# Patient Record
Sex: Female | Born: 1970 | Race: Black or African American | Hispanic: No | Smoking: Never smoker
Health system: Southern US, Community
[De-identification: ages and names within clinical notes are randomized; demographics above are authoritative.]

## PROBLEM LIST (undated history)

## (undated) DIAGNOSIS — K219 Gastro-esophageal reflux disease without esophagitis: Secondary | ICD-10-CM

## (undated) DIAGNOSIS — F419 Anxiety disorder, unspecified: Secondary | ICD-10-CM

## (undated) DIAGNOSIS — I509 Heart failure, unspecified: Secondary | ICD-10-CM

## (undated) DIAGNOSIS — E119 Type 2 diabetes mellitus without complications: Secondary | ICD-10-CM

## (undated) DIAGNOSIS — J45909 Unspecified asthma, uncomplicated: Secondary | ICD-10-CM

## (undated) DIAGNOSIS — R51 Headache: Secondary | ICD-10-CM

## (undated) DIAGNOSIS — D860 Sarcoidosis of lung: Secondary | ICD-10-CM

## (undated) DIAGNOSIS — I10 Essential (primary) hypertension: Secondary | ICD-10-CM

## (undated) DIAGNOSIS — Z8489 Family history of other specified conditions: Secondary | ICD-10-CM

## (undated) DIAGNOSIS — R519 Headache, unspecified: Secondary | ICD-10-CM

## (undated) DIAGNOSIS — M25562 Pain in left knee: Secondary | ICD-10-CM

## (undated) DIAGNOSIS — M1990 Unspecified osteoarthritis, unspecified site: Secondary | ICD-10-CM

## (undated) DIAGNOSIS — G8929 Other chronic pain: Secondary | ICD-10-CM

## (undated) DIAGNOSIS — M25561 Pain in right knee: Secondary | ICD-10-CM

---

## 1993-01-28 HISTORY — PX: TUBAL LIGATION: SHX77

## 2005-01-28 HISTORY — PX: ACHILLES TENDON REPAIR: SUR1153

## 2006-04-29 HISTORY — PX: KNEE ARTHROSCOPY: SHX127

## 2014-09-10 ENCOUNTER — Inpatient Hospital Stay (HOSPITAL_BASED_OUTPATIENT_CLINIC_OR_DEPARTMENT_OTHER)
Admission: EM | Admit: 2014-09-10 | Discharge: 2014-09-13 | DRG: 292 | Disposition: A | Discharge status: Home / Self Care | Payer: Medicare Other | Attending: Internal Medicine | Admitting: Internal Medicine

## 2014-09-10 ENCOUNTER — Encounter (HOSPITAL_BASED_OUTPATIENT_CLINIC_OR_DEPARTMENT_OTHER): Payer: Self-pay | Admitting: *Deleted

## 2014-09-10 ENCOUNTER — Emergency Department (HOSPITAL_BASED_OUTPATIENT_CLINIC_OR_DEPARTMENT_OTHER): Payer: Medicare Other

## 2014-09-10 ENCOUNTER — Other Ambulatory Visit: Payer: Self-pay

## 2014-09-10 DIAGNOSIS — I10 Essential (primary) hypertension: Secondary | ICD-10-CM | POA: Diagnosis present

## 2014-09-10 DIAGNOSIS — I5031 Acute diastolic (congestive) heart failure: Principal | ICD-10-CM | POA: Diagnosis present

## 2014-09-10 DIAGNOSIS — J45909 Unspecified asthma, uncomplicated: Secondary | ICD-10-CM | POA: Diagnosis present

## 2014-09-10 DIAGNOSIS — D860 Sarcoidosis of lung: Secondary | ICD-10-CM | POA: Diagnosis present

## 2014-09-10 DIAGNOSIS — Z885 Allergy status to narcotic agent status: Secondary | ICD-10-CM | POA: Diagnosis not present

## 2014-09-10 DIAGNOSIS — E119 Type 2 diabetes mellitus without complications: Secondary | ICD-10-CM | POA: Diagnosis present

## 2014-09-10 DIAGNOSIS — Z79899 Other long term (current) drug therapy: Secondary | ICD-10-CM

## 2014-09-10 DIAGNOSIS — Z888 Allergy status to other drugs, medicaments and biological substances status: Secondary | ICD-10-CM | POA: Diagnosis not present

## 2014-09-10 DIAGNOSIS — I5032 Chronic diastolic (congestive) heart failure: Secondary | ICD-10-CM

## 2014-09-10 DIAGNOSIS — M1990 Unspecified osteoarthritis, unspecified site: Secondary | ICD-10-CM | POA: Diagnosis present

## 2014-09-10 DIAGNOSIS — Z6841 Body Mass Index (BMI) 40.0 and over, adult: Secondary | ICD-10-CM

## 2014-09-10 DIAGNOSIS — D869 Sarcoidosis, unspecified: Secondary | ICD-10-CM | POA: Diagnosis present

## 2014-09-10 DIAGNOSIS — I509 Heart failure, unspecified: Secondary | ICD-10-CM

## 2014-09-10 DIAGNOSIS — Z91010 Allergy to peanuts: Secondary | ICD-10-CM | POA: Diagnosis not present

## 2014-09-10 HISTORY — DX: Essential (primary) hypertension: I10

## 2014-09-10 HISTORY — DX: Unspecified osteoarthritis, unspecified site: M19.90

## 2014-09-10 HISTORY — DX: Unspecified asthma, uncomplicated: J45.909

## 2014-09-10 LAB — CBC WITH DIFFERENTIAL/PLATELET
BASOS ABS: 0 10*3/uL (ref 0.0–0.1)
Basophils Relative: 0 % (ref 0–1)
EOS PCT: 3 % (ref 0–5)
Eosinophils Absolute: 0.2 10*3/uL (ref 0.0–0.7)
HEMATOCRIT: 36.1 % (ref 36.0–46.0)
HEMOGLOBIN: 11 g/dL — AB (ref 12.0–15.0)
LYMPHS ABS: 1.1 10*3/uL (ref 0.7–4.0)
Lymphocytes Relative: 16 % (ref 12–46)
MCH: 23.3 pg — AB (ref 26.0–34.0)
MCHC: 30.5 g/dL (ref 30.0–36.0)
MCV: 76.5 fL — AB (ref 78.0–100.0)
MONOS PCT: 14 % — AB (ref 3–12)
Monocytes Absolute: 1 10*3/uL (ref 0.1–1.0)
NEUTROS ABS: 4.8 10*3/uL (ref 1.7–7.7)
NEUTROS PCT: 67 % (ref 43–77)
PLATELETS: 395 10*3/uL (ref 150–400)
RBC: 4.72 MIL/uL (ref 3.87–5.11)
RDW: 17.1 % — ABNORMAL HIGH (ref 11.5–15.5)
WBC: 7.2 10*3/uL (ref 4.0–10.5)

## 2014-09-10 LAB — BASIC METABOLIC PANEL
ANION GAP: 7 (ref 5–15)
BUN: 9 mg/dL (ref 6–20)
CO2: 28 mmol/L (ref 22–32)
CREATININE: 0.75 mg/dL (ref 0.44–1.00)
Calcium: 9 mg/dL (ref 8.9–10.3)
Chloride: 103 mmol/L (ref 101–111)
Glucose, Bld: 102 mg/dL — ABNORMAL HIGH (ref 65–99)
POTASSIUM: 3.6 mmol/L (ref 3.5–5.1)
SODIUM: 138 mmol/L (ref 135–145)

## 2014-09-10 LAB — URINALYSIS, ROUTINE W REFLEX MICROSCOPIC
BILIRUBIN URINE: NEGATIVE
GLUCOSE, UA: NEGATIVE mg/dL
Hgb urine dipstick: NEGATIVE
KETONES UR: NEGATIVE mg/dL
LEUKOCYTES UA: NEGATIVE
Nitrite: NEGATIVE
PROTEIN: NEGATIVE mg/dL
SPECIFIC GRAVITY, URINE: 1.021 (ref 1.005–1.030)
UROBILINOGEN UA: 2 mg/dL — AB (ref 0.0–1.0)
pH: 6.5 (ref 5.0–8.0)

## 2014-09-10 LAB — BRAIN NATRIURETIC PEPTIDE: B NATRIURETIC PEPTIDE 5: 225 pg/mL — AB (ref 0.0–100.0)

## 2014-09-10 LAB — TROPONIN I

## 2014-09-10 MED ORDER — FUROSEMIDE 10 MG/ML IJ SOLN
40.0000 mg | Freq: Once | INTRAMUSCULAR | Status: AC
  Administered 2014-09-10: 40 mg via INTRAVENOUS
  Filled 2014-09-10: qty 4

## 2014-09-10 NOTE — ED Notes (Signed)
Pt advised the blood pressure of 161/135 is good for her.

## 2014-09-10 NOTE — ED Notes (Signed)
Pt reports ongoing lower extremity swelling for "a while"- worse x 1 month- pt takes lasix PRN

## 2014-09-10 NOTE — ED Notes (Signed)
Pulse OX at rest before walking was only 91%. We walked 1 lap around department and got very SOB. SAT dropped to 86%. Placed patient on monitor when we arrived back to room, RN and MD aware

## 2014-09-10 NOTE — ED Provider Notes (Signed)
CSN: [id number]     Arrival date & time 09/10/14  1545 History  This chart was scribed for Blake Divine, MD by Placido Sou, ED scribe. This patient was seen in room MH04/MH04 and the patient's care was started at 4:18 PM.   Chief Complaint  Patient presents with  . Leg Swelling   Patient is a 44 y.o. female presenting with shortness of breath. The history is provided by the patient. No language interpreter was used.  Shortness of Breath Severity:  Mild Onset quality:  Unable to specify Duration:  1 month Timing:  Constant Progression:  Waxing and waning Chronicity:  Recurrent Context: activity   Relieved by:  Rest and sitting up Worsened by:  Movement, exertion and activity Associated symptoms: cough   Associated symptoms: no chest pain and no fever   Risk factors: obesity     HPI Comments: Shelby Figueroa is a 44 y.o. female, with a hx of HTN and DM, who presents to the Emergency Department complaining of chronic, moderate, bilateral leg swelling with worsening symptoms beginning 1 month ago. She notes associated pain which she describes as burning and SOB. Pt notes a worsening of her SOB when ambulating or lying in a supine position. She notes being dx with sarcoidosis in 07/2012 and further notes a chronic productive cough with onset s/p this dx. Pt notes a hx of cellulitis to her bilateral legs 2 months ago and notes these issues feel similar. Pt notes taking lasix previously and was taken off of the medication 1 month ago based on her PCP's recommendation. She denies ever receiving any cardiac tests or being dx with CHF. Pt notes having not taken her metformin rx for 2 days due to being out and denies any recent abnormalities with her blood sugar levels. She notes currently taking gabapentin. Pt denies any new fever or chills, worsening symptoms of her chronic productive cough and CP.   Past Medical History  Diagnosis Date  . Arthritis   . Asthma   . Diabetes mellitus without  complication   . Hypertension   . Sarcoidosis    Past Surgical History  Procedure Laterality Date  . Achilles tendon repair Bilateral   . Knee surgery Left    No family history on file. Social History  Substance Use Topics  . Smoking status: Never Smoker   . Smokeless tobacco: Never Used  . Alcohol Use: Yes     Comment: occasional   OB History    No data available     Review of Systems  Constitutional: Negative for fever and chills.  Respiratory: Positive for cough and shortness of breath.   Cardiovascular: Positive for leg swelling. Negative for chest pain.  Musculoskeletal: Positive for myalgias, joint swelling and arthralgias.  All other systems reviewed and are negative.   Allergies  Codeine  Home Medications   Prior to Admission medications   Medication Sig Start Date End Date Taking? Authorizing Provider  amLODipine (NORVASC) 5 MG tablet Take 5 mg by mouth daily.   Yes Historical Provider, MD  citalopram (CELEXA) 40 MG tablet Take 40 mg by mouth daily.   Yes Historical Provider, MD  furosemide (LASIX) 20 MG tablet Take 10 mg by mouth.   Yes Historical Provider, MD  gabapentin (NEURONTIN) 300 MG capsule Take 300 mg by mouth 3 (three) times daily.   Yes Historical Provider, MD  metFORMIN (GLUCOPHAGE) 500 MG tablet Take by mouth 2 (two) times daily with a meal.   Yes Historical Provider,  MD  metoprolol succinate (TOPROL-XL) 50 MG 24 hr tablet Take 50 mg by mouth daily. Take with or immediately following a meal.   Yes Historical Provider, MD   BP 161/135 mmHg  Pulse 80  Temp(Src) 98.9 F (37.2 C) (Oral)  Resp 18  Ht 5\' 5"  (1.651 m)  Wt 347 lb (157.398 kg)  BMI 57.74 kg/m2  SpO2 94%  LMP 08/18/2014 Physical Exam  Constitutional: She is oriented to person, place, and time. She appears well-developed and well-nourished. No distress.  Obese  HENT:  Head: Normocephalic and atraumatic.  Mouth/Throat: Oropharynx is clear and moist.  Eyes: Conjunctivae are normal.  Pupils are equal, round, and reactive to light. No scleral icterus.  Neck: Neck supple.  Cardiovascular: Normal rate, regular rhythm, normal heart sounds and intact distal pulses.   No murmur heard. Pulmonary/Chest: Effort normal and breath sounds normal. No stridor. No respiratory distress. She has no wheezes. She has no rales.  Abdominal: Soft. Bowel sounds are normal. She exhibits no distension. There is no tenderness.  Musculoskeletal: Normal range of motion. She exhibits edema (trace edema bilateral lower extremity. exam limited by obesity.).  Neurological: She is alert and oriented to person, place, and time.  Skin: Skin is warm and dry. No rash noted.  Psychiatric: She has a normal mood and affect. Her behavior is normal.  Nursing note and vitals reviewed.   ED Course  Procedures  DIAGNOSTIC STUDIES: Oxygen Saturation is 94% on RA, adequate by my interpretation.    COORDINATION OF CARE: 4:26 PM Discussed treatment plan with pt at bedside and pt agreed to plan.  Labs Review Labs Reviewed  CBC WITH DIFFERENTIAL/PLATELET - Abnormal; Notable for the following:    Hemoglobin 11.0 (*)    MCV 76.5 (*)    MCH 23.3 (*)    RDW 17.1 (*)    Monocytes Relative 14 (*)    All other components within normal limits  BASIC METABOLIC PANEL - Abnormal; Notable for the following:    Glucose, Bld 102 (*)    All other components within normal limits  URINALYSIS, ROUTINE W REFLEX MICROSCOPIC (NOT AT [facility]) - Abnormal; Notable for the following:    APPearance CLOUDY (*)    Urobilinogen, UA 2.0 (*)    All other components within normal limits  BRAIN NATRIURETIC PEPTIDE - Abnormal; Notable for the following:    B Natriuretic Peptide 225.0 (*)    All other components within normal limits  TROPONIN I    Imaging Review Dg Chest 2 View  09/10/2014   CLINICAL DATA:  44 year old female with bilateral lower extremity swelling and shortness breath for the past 6 weeks.  EXAM: CHEST  2 VIEW   COMPARISON:  No priors.  FINDINGS: There is cephalization of the pulmonary vasculature, indistinctness of the interstitial markings, and patchy airspace disease throughout the lungs bilaterally suggestive of moderate pulmonary edema. No pleural effusions. Mild cardiomegaly. Upper mediastinal contours are within normal limits.  IMPRESSION: 1. The appearance the chest suggests congestive heart failure, although some of the interstitial prominence noted may be chronic based on comparison to prior CT of the abdomen and pelvis 07/03/2014.   Electronically Signed   By: Trudie Reed M.D.   On: 09/10/2014 17:32   I, Blake Divine, MD, personally reviewed and evaluated these images and lab results as part of my medical decision-making.   EKG Interpretation None     EKG - NSR, rate 83, normal axis, QTc 493, no ST/T abnormalities. MDM  Final diagnoses:  Acute congestive heart failure, unspecified congestive heart failure type    Patient became extremely dyspneic and and also hypoxic upon ambulating. Her workups consistent with CHF. Given 40 mg grams of IV Lasix. Plan admit.  I personally performed the services described in this documentation, which was scribed in my presence. The recorded information has been reviewed and is accurate.     Blake Divine, MD 09/10/14 [phone]

## 2014-09-10 NOTE — ED Notes (Signed)
ECG was completed and copy given to Dr. At 1913 pm but appears did not cross over.

## 2014-09-11 ENCOUNTER — Encounter (HOSPITAL_COMMUNITY): Payer: Self-pay | Admitting: Surgery

## 2014-09-11 DIAGNOSIS — J45909 Unspecified asthma, uncomplicated: Secondary | ICD-10-CM

## 2014-09-11 DIAGNOSIS — D869 Sarcoidosis, unspecified: Secondary | ICD-10-CM | POA: Diagnosis present

## 2014-09-11 DIAGNOSIS — I5031 Acute diastolic (congestive) heart failure: Principal | ICD-10-CM

## 2014-09-11 DIAGNOSIS — I10 Essential (primary) hypertension: Secondary | ICD-10-CM | POA: Diagnosis present

## 2014-09-11 DIAGNOSIS — E119 Type 2 diabetes mellitus without complications: Secondary | ICD-10-CM

## 2014-09-11 LAB — GLUCOSE, CAPILLARY
GLUCOSE-CAPILLARY: 117 mg/dL — AB (ref 65–99)
GLUCOSE-CAPILLARY: 152 mg/dL — AB (ref 65–99)
GLUCOSE-CAPILLARY: 97 mg/dL (ref 65–99)
GLUCOSE-CAPILLARY: 108 mg/dL — AB (ref 65–99)
Glucose-Capillary: 112 mg/dL — ABNORMAL HIGH (ref 65–99)
Glucose-Capillary: 167 mg/dL — ABNORMAL HIGH (ref 65–99)

## 2014-09-11 MED ORDER — SODIUM CHLORIDE 0.9 % IJ SOLN: 3.0000 mL | INTRAMUSCULAR | Status: DC | PRN

## 2014-09-11 MED ORDER — IPRATROPIUM-ALBUTEROL 0.5-2.5 (3) MG/3ML IN SOLN
3.0000 mL | Freq: Three times a day (TID) | RESPIRATORY_TRACT | Status: DC
  Administered 2014-09-11 – 2014-09-13 (×7): 3 mL via RESPIRATORY_TRACT
  Filled 2014-09-11 (×7): qty 3

## 2014-09-11 MED ORDER — SODIUM CHLORIDE 0.9 % IV SOLN: 250.0000 mL | INTRAVENOUS | Status: DC | PRN

## 2014-09-11 MED ORDER — INSULIN ASPART 100 UNIT/ML ~~LOC~~ SOLN: 0.0000 [IU] | Freq: Every day | SUBCUTANEOUS | Status: DC

## 2014-09-11 MED ORDER — METOPROLOL SUCCINATE ER 50 MG PO TB24
50.0000 mg | ORAL_TABLET | Freq: Every day | ORAL | Status: DC
  Administered 2014-09-11 – 2014-09-13 (×3): 50 mg via ORAL
  Filled 2014-09-11 (×3): qty 1

## 2014-09-11 MED ORDER — LISINOPRIL 2.5 MG PO TABS
2.5000 mg | ORAL_TABLET | Freq: Every day | ORAL | Status: DC
  Administered 2014-09-11: 2.5 mg via ORAL
  Filled 2014-09-11: qty 1

## 2014-09-11 MED ORDER — SODIUM CHLORIDE 0.9 % IJ SOLN
3.0000 mL | Freq: Two times a day (BID) | INTRAMUSCULAR | Status: DC
  Administered 2014-09-11 – 2014-09-12 (×5): 3 mL via INTRAVENOUS

## 2014-09-11 MED ORDER — INSULIN ASPART 100 UNIT/ML ~~LOC~~ SOLN
0.0000 [IU] | Freq: Three times a day (TID) | SUBCUTANEOUS | Status: DC
  Administered 2014-09-11: 2 [IU] via SUBCUTANEOUS
  Administered 2014-09-12: 1 [IU] via SUBCUTANEOUS

## 2014-09-11 MED ORDER — AMLODIPINE BESYLATE 5 MG PO TABS
5.0000 mg | ORAL_TABLET | Freq: Every day | ORAL | Status: DC
  Administered 2014-09-11 – 2014-09-13 (×3): 5 mg via ORAL
  Filled 2014-09-11 (×3): qty 1

## 2014-09-11 MED ORDER — ONDANSETRON HCL 4 MG/2ML IJ SOLN: 4.0000 mg | Freq: Four times a day (QID) | INTRAMUSCULAR | Status: DC | PRN

## 2014-09-11 MED ORDER — ACETAMINOPHEN 325 MG PO TABS
650.0000 mg | ORAL_TABLET | ORAL | Status: DC | PRN
  Administered 2014-09-11 – 2014-09-12 (×3): 650 mg via ORAL
  Filled 2014-09-11 (×3): qty 2

## 2014-09-11 MED ORDER — POTASSIUM CHLORIDE CRYS ER 20 MEQ PO TBCR
20.0000 meq | EXTENDED_RELEASE_TABLET | Freq: Two times a day (BID) | ORAL | Status: AC
  Administered 2014-09-11 – 2014-09-12 (×4): 20 meq via ORAL
  Filled 2014-09-11 (×5): qty 1

## 2014-09-11 MED ORDER — FUROSEMIDE 10 MG/ML IJ SOLN
40.0000 mg | Freq: Two times a day (BID) | INTRAMUSCULAR | Status: AC
  Administered 2014-09-11 – 2014-09-12 (×4): 40 mg via INTRAVENOUS
  Filled 2014-09-11 (×5): qty 4

## 2014-09-11 MED ORDER — ENOXAPARIN SODIUM 40 MG/0.4ML ~~LOC~~ SOLN
40.0000 mg | SUBCUTANEOUS | Status: DC
  Administered 2014-09-11 – 2014-09-13 (×3): 40 mg via SUBCUTANEOUS
  Filled 2014-09-11 (×5): qty 0.4

## 2014-09-11 MED ORDER — GABAPENTIN 300 MG PO CAPS
300.0000 mg | ORAL_CAPSULE | Freq: Three times a day (TID) | ORAL | Status: DC
  Administered 2014-09-11 – 2014-09-13 (×8): 300 mg via ORAL
  Filled 2014-09-11 (×9): qty 1

## 2014-09-11 MED ORDER — ALBUTEROL SULFATE (2.5 MG/3ML) 0.083% IN NEBU: 2.5000 mg | INHALATION_SOLUTION | RESPIRATORY_TRACT | Status: DC | PRN

## 2014-09-11 MED ORDER — CITALOPRAM HYDROBROMIDE 40 MG PO TABS
40.0000 mg | ORAL_TABLET | Freq: Every day | ORAL | Status: DC
  Administered 2014-09-11 – 2014-09-13 (×3): 40 mg via ORAL
  Filled 2014-09-11 (×3): qty 1

## 2014-09-11 MED ORDER — OXYCODONE HCL 5 MG PO TABS
5.0000 mg | ORAL_TABLET | Freq: Four times a day (QID) | ORAL | Status: DC | PRN
  Administered 2014-09-12: 5 mg via ORAL
  Filled 2014-09-11: qty 1

## 2014-09-11 NOTE — H&P (Signed)
[facility] Admission History and Physical       Shelby Figueroa ZOX:[id number] DOB: 09/18/1970 DOA: 09/10/2014  Referring physician: EDP PCP: COPPER, Landis Gandy, PA-C  Specialists:   Chief Complaint: SOB and Swelling in Both Legs  HPI: Shelby Figueroa is a 44 y.o. female with a history of HTN, DM2, Sarcoid and Asthma who presented to the [facility] ED with complaints of worsening SOB, and Swelling of her lower legs over the past 6 weeks. She denies having any chest pain, or fevers or chills.   She was found to have hypoxia into the mid-80's in the ED and has to be placed on 2 liters NCO2.   She was found on evaluation to have CHF findings on Chest X-ray and an elevated BNP of 225.0 and was administered 40 mg of IV Lasix x 1.  She was transferred to [facility] for further evaluation and treatment.       Review of Systems:  Constitutional: No Weight Loss, No Weight Gain, Night Sweats, Fevers, Chills, Dizziness, Light Headedness, Fatigue, or Generalized Weakness HEENT: No Headaches, Difficulty Swallowing,Tooth/Dental Problems,Sore Throat,  No Sneezing, Rhinitis, Ear Ache, Nasal Congestion, or Post Nasal Drip,  Cardio-vascular:  No Chest pain, +Orthopnea, PND, +Edema in Lower Extremities, Anasarca, Dizziness, Palpitations  Resp:  +Dyspnea, +DOE, No Productive Cough, No Non-Productive Cough, No Hemoptysis, No Wheezing.    GI: No Heartburn, Indigestion, Abdominal Pain, Nausea, Vomiting, Diarrhea, Constipation, Hematemesis, Hematochezia, Melena, Change in Bowel Habits,  Loss of Appetite  GU: No Dysuria, No Change in Color of Urine, No Urgency or Urinary Frequency, No Flank pain.  Musculoskeletal: No Joint Pain or Swelling, No Decreased Range of Motion, No Back Pain.  Neurologic: No Syncope, No Seizures, Muscle Weakness, Paresthesia, Vision Disturbance or Loss, No Diplopia, No Vertigo, No Difficulty Walking,  Skin: No Rash or Lesions. Psych: No Change in Mood or Affect, No Depression or Anxiety, No  Memory loss, No Confusion, or Hallucinations   Past Medical History  Diagnosis Date  . Arthritis   . Asthma   . Diabetes mellitus without complication   . Hypertension   . Sarcoidosis      Past Surgical History  Procedure Laterality Date  . Achilles tendon repair Bilateral   . Knee surgery Left   . Tubal ligation      20 years ago      Prior to Admission medications   Medication Sig Start Date End Date Taking? Authorizing Provider  amLODipine (NORVASC) 5 MG tablet Take 5 mg by mouth daily.   Yes Historical Provider, MD  citalopram (CELEXA) 40 MG tablet Take 40 mg by mouth daily.   Yes Historical Provider, MD  furosemide (LASIX) 20 MG tablet Take 10 mg by mouth.   Yes Historical Provider, MD  gabapentin (NEURONTIN) 300 MG capsule Take 300 mg by mouth 3 (three) times daily.   Yes Historical Provider, MD  metFORMIN (GLUCOPHAGE) 500 MG tablet Take by mouth 2 (two) times daily with a meal.   Yes Historical Provider, MD  metoprolol succinate (TOPROL-XL) 50 MG 24 hr tablet Take 50 mg by mouth daily. Take with or immediately following a meal.   Yes Historical Provider, MD     Allergies  Allergen Reactions  . Codeine Itching  . Peanuts [Peanut Oil]     Itching    Social History:  reports that she has never smoked. She has never used smokeless tobacco. She reports that she drinks alcohol. She reports that she does not use illicit drugs.  History reviewed. No pertinent family history.     Physical Exam:  GEN:  Pleasant Obese  44 y.o. African American female examined and in no acute distress; cooperative with exam Filed Vitals:   09/10/14 2115 09/10/14 2133 09/10/14 2133 09/10/14 2209  BP:  158/94 159/82 146/83  Pulse: 83 81 82 78  Temp:      TempSrc:   Oral   Resp: 21 24 20 23   Height:      Weight:      SpO2: 100% 98% 98% 98%   Blood pressure 146/83, pulse 78, temperature 98.9 F (37.2 C), temperature source Oral, resp. rate 23, height 5\' 5"  (1.651 m), weight  157.398 kg (347 lb), last menstrual period 08/18/2014, SpO2 98 %. PSYCH: She is alert and oriented x4; does not appear anxious does not appear depressed; affect is normal HEENT: Normocephalic and Atraumatic, Mucous membranes pink; PERRLA; EOM intact; Fundi:  Benign;  No scleral icterus, Nares: Patent, Oropharynx: Clear, Fair Dentition,    Neck:  FROM, No Cervical Lymphadenopathy nor Thyromegaly or Carotid Bruit; No JVD; Breasts:: Not examined CHEST WALL: No tenderness CHEST: Normal respiration, clear to auscultation bilaterally HEART: Regular rate and rhythm; no murmurs rubs or gallops BACK: No kyphosis or scoliosis; No CVA tenderness ABDOMEN: Positive Bowel Sounds, Obese, Soft Non-Tender, No Rebound or Guarding; No Masses, No Organomegaly. Rectal Exam: Not done EXTREMITIES: No Cyanosis Clubbing, 2+ EDEMA of BLEs    Genitalia: not examined PULSES: 2+ and symmetric SKIN: Normal hydration no rash or ulceration CNS:  Alert and Oriented x 4, No Focal Deficits Vascular: pulses palpable throughout    Labs on Admission:  Basic Metabolic Panel:  Recent Labs Lab 09/10/14 1705  NA 138  K 3.6  CL 103  CO2 28  GLUCOSE 102*  BUN 9  CREATININE 0.75  CALCIUM 9.0   Liver Function Tests: No results for input(s): AST, ALT, ALKPHOS, BILITOT, PROT, ALBUMIN in the last 168 hours. No results for input(s): LIPASE, AMYLASE in the last 168 hours. No results for input(s): AMMONIA in the last 168 hours. CBC:  Recent Labs Lab 09/10/14 1705  WBC 7.2  NEUTROABS 4.8  HGB 11.0*  HCT 36.1  MCV 76.5*  PLT 395   Cardiac Enzymes:  Recent Labs Lab 09/10/14 1705  TROPONINI <0.03    BNP (last 3 results)  Recent Labs  09/10/14 1705  BNP 225.0*    ProBNP (last 3 results) No results for input(s): PROBNP in the last 8760 hours.  CBG: No results for input(s): GLUCAP in the last 168 hours.  Radiological Exams on Admission: Dg Chest 2 View  09/10/2014   CLINICAL DATA:  44 year old female  with bilateral lower extremity swelling and shortness breath for the past 6 weeks.  EXAM: CHEST  2 VIEW  COMPARISON:  No priors.  FINDINGS: There is cephalization of the pulmonary vasculature, indistinctness of the interstitial markings, and patchy airspace disease throughout the lungs bilaterally suggestive of moderate pulmonary edema. No pleural effusions. Mild cardiomegaly. Upper mediastinal contours are within normal limits.  IMPRESSION: 1. The appearance the chest suggests congestive heart failure, although some of the interstitial prominence noted may be chronic based on comparison to prior CT of the abdomen and pelvis 07/03/2014.   Electronically Signed   By: Trudie Reed M.D.   On: 09/10/2014 17:32     EKG: Independently reviewed. Normal Sinus Rhythm   Rate = 83, No Acute S-T changes        Assessment/Plan:  44 y.o. female with  Principal Problem:   1.     Acute CHF- Probably Diastolic   Acute CHF Protocol    Diurese with IV lasix 40 mg q 12 hrs x 4 doses    Monitor I/Os and Daily Weights   Continue Toprol Xl   Add Ace Inhibitor - Lisinopril 2.5 mg PO q day   2D ECHO in AM   O2 PRN   Monitor O2 sats  Active Problems:   2.    Diabetes mellitus without complication   Hold Metformin Rx   SSi coverage PRN   Check HbA1C in AM     3.    Hypertension   Continue Amlodipine, and Toprol Rx   Adjust lasix dosage   And Lisinopril added     4.    Sarcoidosis   stable     5.    Asthma   PRN Albuterol Nebs    6.     DVT Prophylaxis   Lovenox    Code Status:     FULL CODE        Family Communication:   No Family Present    Disposition Plan:    Inpatient Status        Time spent:  75 Minutes      Ron Parker [facility] Pager [phone]   If 7AM -7PM Please Contact the Day Rounding Team MD for [facility]  If 7PM-7AM, Please Contact Night-Floor Coverage  [email or web address] Password [email or web address] 09/11/2014, 12:30 AM     ADDENDUM:   Patient was seen  and examined on 09/11/2014

## 2014-09-11 NOTE — Progress Notes (Signed)
PATIENT DETAILS Name: Shelby Figueroa Age: 44 y.o. Sex: female Date of Birth: [birth year] Admit Date: 09/10/2014 Admitting Physician Ron Parker, MD ZOX:[id number], JOSEPH B, PA-C  Subjective: Decreased  lower extremity edema, less short of breath today.  Assessment/Plan: Principal Problem: Presumed acute diastolic heart failure: Continue IV Lasix, given history of sarcoidosis-could have a pulmonary hypertension/RHF-await echocardiogram. Discontinue ACE inhibitor-has history of angioedema  Active Problems: Pulmonary sarcoidosis: Not on steroids, check Ace level. Follow  Essential hypertension: Controlled-continue amlodipine and metoprolol  Type 2 diabetes: Continue to hold metformin-CBGs controlled with SSI  Morbid obesity: Tonsil regarding importance of weight loss. Likely needs outpatient pulmonary follow-up for presumed OSA and sleep study  Disposition: Remain inpatient  Antimicrobial agents  See below  Anti-infectives    None      DVT Prophylaxis: Prophylactic Lovenox   Code Status: Full code   Family Communication None  Procedures: None  CONSULTS:  None  Time spent 30 minutes-Greater than 50% of this time was spent in counseling, explanation of diagnosis, planning of further management, and coordination of care.  MEDICATIONS: Scheduled Meds: . amLODipine  5 mg Oral Daily  . citalopram  40 mg Oral Daily  . enoxaparin (LOVENOX) injection  40 mg Subcutaneous Q24H  . furosemide  40 mg Intravenous Q12H  . gabapentin  300 mg Oral TID  . insulin aspart  0-5 Units Subcutaneous QHS  . insulin aspart  0-9 Units Subcutaneous TID WC  . metoprolol succinate  50 mg Oral Daily  . potassium chloride  20 mEq Oral BID  . sodium chloride  3 mL Intravenous Q12H   Continuous Infusions:  PRN Meds:.sodium chloride, acetaminophen, ondansetron (ZOFRAN) IV, sodium chloride    PHYSICAL EXAM: Vital signs in last 24 hours: Filed Vitals:   09/10/14  2209 09/11/14 0125 09/11/14 0314 09/11/14 0635  BP: 146/83 145/85 142/88 122/76  Pulse: 78 82 76 81  Temp:  98.3 F (36.8 C) 98.3 F (36.8 C) 98.4 F (36.9 C)  TempSrc:  Oral Oral Oral  Resp: 23 20 24 22   Height:      Weight:    161.118 kg (355 lb 3.2 oz)  SpO2: 98% 99% 99% 99%    Weight change:  Filed Weights   09/10/14 1600 09/11/14 0635  Weight: 157.398 kg (347 lb) 161.118 kg (355 lb 3.2 oz)   Body mass index is 59.11 kg/(m^2).   Gen Exam: Awake and alert with clear speech.   Neck: Supple, No JVD.   Chest: +rales bilaterally CVS: S1 S2 Regular, no murmurs.  Abdomen: soft, BS +, non tender, non distended.  Extremities: ++ edema, lower extremities warm to touch. Neurologic: Non Focal.   Skin: No Rash.   Wounds: N/A.   Intake/Output from previous day: No intake or output data in the 24 hours ending 09/11/14 1342   LAB RESULTS: CBC  Recent Labs Lab 09/10/14 1705  WBC 7.2  HGB 11.0*  HCT 36.1  PLT 395  MCV 76.5*  MCH 23.3*  MCHC 30.5  RDW 17.1*  LYMPHSABS 1.1  MONOABS 1.0  EOSABS 0.2  BASOSABS 0.0    Chemistries   Recent Labs Lab 09/10/14 1705  NA 138  K 3.6  CL 103  CO2 28  GLUCOSE 102*  BUN 9  CREATININE 0.75  CALCIUM 9.0    CBG:  Recent Labs Lab 09/11/14 0021 09/11/14 0658 09/11/14 1138  GLUCAP 112* 117* 152*  GFR Estimated Creatinine Clearance: 136.8 mL/min (by C-G formula based on Cr of 0.75).  Coagulation profile No results for input(s): INR, PROTIME in the last 168 hours.  Cardiac Enzymes  Recent Labs Lab 09/10/14 1705  TROPONINI <0.03    Invalid input(s): POCBNP No results for input(s): DDIMER in the last 72 hours. No results for input(s): HGBA1C in the last 72 hours. No results for input(s): CHOL, HDL, LDLCALC, TRIG, CHOLHDL, LDLDIRECT in the last 72 hours. No results for input(s): TSH, T4TOTAL, T3FREE, THYROIDAB in the last 72 hours.  Invalid input(s): FREET3 No results for input(s): VITAMINB12, FOLATE,  FERRITIN, TIBC, IRON, RETICCTPCT in the last 72 hours. No results for input(s): LIPASE, AMYLASE in the last 72 hours.  Urine Studies No results for input(s): UHGB, CRYS in the last 72 hours.  Invalid input(s): UACOL, UAPR, USPG, UPH, UTP, UGL, UKET, UBIL, UNIT, UROB, ULEU, UEPI, UWBC, URBC, UBAC, CAST, UCOM, BILUA  MICROBIOLOGY: No results found for this or any previous visit (from the past 240 hour(s)).  RADIOLOGY STUDIES/RESULTS: Dg Chest 2 View  09/10/2014   CLINICAL DATA:  44 year old female with bilateral lower extremity swelling and shortness breath for the past 6 weeks.  EXAM: CHEST  2 VIEW  COMPARISON:  No priors.  FINDINGS: There is cephalization of the pulmonary vasculature, indistinctness of the interstitial markings, and patchy airspace disease throughout the lungs bilaterally suggestive of moderate pulmonary edema. No pleural effusions. Mild cardiomegaly. Upper mediastinal contours are within normal limits.  IMPRESSION: 1. The appearance the chest suggests congestive heart failure, although some of the interstitial prominence noted may be chronic based on comparison to prior CT of the abdomen and pelvis 07/03/2014.   Electronically Signed   By: Trudie Reed M.D.   On: 09/10/2014 17:32    Jeoffrey Massed, MD  [facility] Pager:[phone]  If 7PM-7AM, please contact night-coverage [email or web address] Password [email or web address] 09/11/2014, 1:42 PM   LOS: 1 day

## 2014-09-11 NOTE — Progress Notes (Signed)
Patient transferred to room 3E20 via [facility] [place]. Educated and oriented patient to the heart failure floor and room equipment. VSS. Will continue to monitor patient to end of shift.

## 2014-09-12 ENCOUNTER — Inpatient Hospital Stay (HOSPITAL_COMMUNITY): Payer: Medicare Other

## 2014-09-12 DIAGNOSIS — I509 Heart failure, unspecified: Secondary | ICD-10-CM

## 2014-09-12 DIAGNOSIS — E119 Type 2 diabetes mellitus without complications: Secondary | ICD-10-CM

## 2014-09-12 DIAGNOSIS — D869 Sarcoidosis, unspecified: Secondary | ICD-10-CM

## 2014-09-12 DIAGNOSIS — I10 Essential (primary) hypertension: Secondary | ICD-10-CM

## 2014-09-12 LAB — ANGIOTENSIN CONVERTING ENZYME: Angiotensin-Converting Enzyme: 77 U/L (ref 14–82)

## 2014-09-12 LAB — BASIC METABOLIC PANEL
ANION GAP: 6 (ref 5–15)
BUN: 8 mg/dL (ref 6–20)
CHLORIDE: 101 mmol/L (ref 101–111)
CO2: 28 mmol/L (ref 22–32)
CREATININE: 0.65 mg/dL (ref 0.44–1.00)
Calcium: 8.5 mg/dL — ABNORMAL LOW (ref 8.9–10.3)
GFR calc non Af Amer: >60 mL/min (ref >60)
Glucose, Bld: 111 mg/dL — ABNORMAL HIGH (ref 65–99)
Potassium: 3.8 mmol/L (ref 3.5–5.1)
SODIUM: 135 mmol/L (ref 135–145)

## 2014-09-12 LAB — GLUCOSE, CAPILLARY
GLUCOSE-CAPILLARY: 138 mg/dL — AB (ref 65–99)
GLUCOSE-CAPILLARY: 94 mg/dL (ref 65–99)
Glucose-Capillary: 125 mg/dL — ABNORMAL HIGH (ref 65–99)
Glucose-Capillary: 125 mg/dL — ABNORMAL HIGH (ref 65–99)

## 2014-09-12 LAB — HEMOGLOBIN A1C
HEMOGLOBIN A1C: 6.4 % — AB (ref 4.8–5.6)
MEAN PLASMA GLUCOSE: 137 mg/dL

## 2014-09-12 MED ORDER — IPRATROPIUM-ALBUTEROL 0.5-2.5 (3) MG/3ML IN SOLN
RESPIRATORY_TRACT | Status: AC
  Filled 2014-09-12: qty 3

## 2014-09-12 NOTE — Progress Notes (Signed)
Echocardiogram 2D Echocardiogram has been performed.  Shelby Figueroa 09/12/2014, 2:01 PM

## 2014-09-12 NOTE — Progress Notes (Signed)
PATIENT DETAILS Name: Shelby Figueroa Age: 44 y.o. Sex: female Date of Birth: 09-25-1970 Admit Date: 09/10/2014 Admitting Physician Ron Parker, MD ZOX:[id number], JOSEPH B, PA-C  Subjective: Decreased lower extremity edema-no other complaints  Assessment/Plan: Principal Problem: Presumed acute diastolic heart failure: Continue IV Lasix, given history of sarcoidosis-could have a pulmonary hypertension/RHF-await echocardiogram. Decrease in leg edema-doubt weight and I&O's are accurate. Discontinue ACE inhibitor-has history of angioedema, will also discontinue Amlodipine as it can cause edema as well.  Active Problems: Pulmonary sarcoidosis: Not on steroids, check Ace level. Follow  Essential hypertension: Controlled-continue metoprolol.Discontinue Amlodipine as it can cause edema as well.  Type 2 diabetes: Continue to hold metformin-CBGs controlled with SSI  Morbid obesity: Counseled regarding importance of weight loss. Likely needs outpatient pulmonary follow-up for presumed OSA and sleep study  Disposition: Remain inpatient-home in next 1-2 days if clinical improvement continues  Antimicrobial agents  See below  Anti-infectives    None      DVT Prophylaxis: Prophylactic Lovenox   Code Status: Full code   Family Communication None  Procedures: None  CONSULTS:  None  Time spent 30 minutes-Greater than 50% of this time was spent in counseling, explanation of diagnosis, planning of further management, and coordination of care.  MEDICATIONS: Scheduled Meds: . amLODipine  5 mg Oral Daily  . citalopram  40 mg Oral Daily  . enoxaparin (LOVENOX) injection  40 mg Subcutaneous Q24H  . furosemide  40 mg Intravenous Q12H  . gabapentin  300 mg Oral TID  . insulin aspart  0-5 Units Subcutaneous QHS  . insulin aspart  0-9 Units Subcutaneous TID WC  . ipratropium-albuterol  3 mL Nebulization TID  . metoprolol succinate  50 mg Oral Daily  . sodium  chloride  3 mL Intravenous Q12H   Continuous Infusions:  PRN Meds:.sodium chloride, acetaminophen, albuterol, ondansetron (ZOFRAN) IV, oxyCODONE, sodium chloride    PHYSICAL EXAM: Vital signs in last 24 hours: Filed Vitals:   09/11/14 2143 09/12/14 0256 09/12/14 0501 09/12/14 0943  BP: 138/89 125/85 134/75   Pulse: 78 86 82   Temp: 98.3 F (36.8 C) 98.3 F (36.8 C) 97.7 F (36.5 C)   TempSrc: Oral Oral Oral   Resp: 22 23 20    Height:      Weight:   161.571 kg (356 lb 3.2 oz)   SpO2: 98% 96% 99% 96%    Weight change: 4.173 kg (9 lb 3.2 oz) Filed Weights   09/10/14 1600 09/11/14 0635 09/12/14 0501  Weight: 157.398 kg (347 lb) 161.118 kg (355 lb 3.2 oz) 161.571 kg (356 lb 3.2 oz)   Body mass index is 59.27 kg/(m^2).   Gen Exam: Awake and alert with clear speech.   Neck: Supple, No JVD.   Chest: few bibasilar rales CVS: S1 S2 Regular, no murmurs.  Abdomen: soft, BS +, non tender, non distended.  Extremities: + edema, lower extremities warm to touch. Neurologic: Non Focal.   Skin: No Rash.   Wounds: N/A.   Intake/Output from previous day:  Intake/Output Summary (Last 24 hours) at 09/12/14 1144 Last data filed at 09/12/14 1002  Gross per 24 hour  Intake    940 ml  Output    854 ml  Net     86 ml     LAB RESULTS: CBC  Recent Labs Lab 09/10/14 1705  WBC 7.2  HGB 11.0*  HCT 36.1  PLT 395  MCV  76.5*  MCH 23.3*  MCHC 30.5  RDW 17.1*  LYMPHSABS 1.1  MONOABS 1.0  EOSABS 0.2  BASOSABS 0.0    Chemistries   Recent Labs Lab 09/10/14 1705 09/12/14 0357  NA 138 135  K 3.6 3.8  CL 103 101  CO2 28 28  GLUCOSE 102* 111*  BUN 9 8  CREATININE 0.75 0.65  CALCIUM 9.0 8.5*    CBG:  Recent Labs Lab 09/11/14 1138 09/11/14 1702 09/11/14 1935 09/11/14 2145 09/12/14 0648  GLUCAP 152* 97 167* 108* 138*    GFR Estimated Creatinine Clearance: 137.1 mL/min (by C-G formula based on Cr of 0.65).  Coagulation profile No results for input(s): INR,  PROTIME in the last 168 hours.  Cardiac Enzymes  Recent Labs Lab 09/10/14 1705  TROPONINI <0.03    Invalid input(s): POCBNP No results for input(s): DDIMER in the last 72 hours.  Recent Labs  09/11/14 0350  HGBA1C 6.4*   No results for input(s): CHOL, HDL, LDLCALC, TRIG, CHOLHDL, LDLDIRECT in the last 72 hours. No results for input(s): TSH, T4TOTAL, T3FREE, THYROIDAB in the last 72 hours.  Invalid input(s): FREET3 No results for input(s): VITAMINB12, FOLATE, FERRITIN, TIBC, IRON, RETICCTPCT in the last 72 hours. No results for input(s): LIPASE, AMYLASE in the last 72 hours.  Urine Studies No results for input(s): UHGB, CRYS in the last 72 hours.  Invalid input(s): UACOL, UAPR, USPG, UPH, UTP, UGL, UKET, UBIL, UNIT, UROB, ULEU, UEPI, UWBC, URBC, UBAC, CAST, UCOM, BILUA  MICROBIOLOGY: No results found for this or any previous visit (from the past 240 hour(s)).  RADIOLOGY STUDIES/RESULTS: Dg Chest 2 View  09/10/2014   CLINICAL DATA:  45 year old female with bilateral lower extremity swelling and shortness breath for the past 6 weeks.  EXAM: CHEST  2 VIEW  COMPARISON:  No priors.  FINDINGS: There is cephalization of the pulmonary vasculature, indistinctness of the interstitial markings, and patchy airspace disease throughout the lungs bilaterally suggestive of moderate pulmonary edema. No pleural effusions. Mild cardiomegaly. Upper mediastinal contours are within normal limits.  IMPRESSION: 1. The appearance the chest suggests congestive heart failure, although some of the interstitial prominence noted may be chronic based on comparison to prior CT of the abdomen and pelvis 07/03/2014.   Electronically Signed   By: Trudie Reed M.D.   On: 09/10/2014 17:32    Jeoffrey Massed, MD  [facility] Pager:[phone]  If 7PM-7AM, please contact night-coverage [email or web address] Password [email or web address] 09/12/2014, 11:44 AM   LOS: 2 days

## 2014-09-12 NOTE — Progress Notes (Signed)
UR completed 

## 2014-09-13 ENCOUNTER — Inpatient Hospital Stay (HOSPITAL_COMMUNITY): Payer: Medicare Other

## 2014-09-13 LAB — BASIC METABOLIC PANEL
ANION GAP: 8 (ref 5–15)
BUN: 8 mg/dL (ref 6–20)
CHLORIDE: 99 mmol/L — AB (ref 101–111)
CO2: 29 mmol/L (ref 22–32)
Calcium: 8.9 mg/dL (ref 8.9–10.3)
Creatinine, Ser: 0.66 mg/dL (ref 0.44–1.00)
GFR calc non Af Amer: >60 mL/min (ref >60)
Glucose, Bld: 95 mg/dL (ref 65–99)
POTASSIUM: 4 mmol/L (ref 3.5–5.1)
SODIUM: 136 mmol/L (ref 135–145)

## 2014-09-13 LAB — ANGIOTENSIN CONVERTING ENZYME: Angiotensin-Converting Enzyme: 77 U/L (ref 14–82)

## 2014-09-13 LAB — GLUCOSE, CAPILLARY
GLUCOSE-CAPILLARY: 112 mg/dL — AB (ref 65–99)
GLUCOSE-CAPILLARY: 97 mg/dL (ref 65–99)
Glucose-Capillary: 97 mg/dL (ref 65–99)

## 2014-09-13 MED ORDER — HYDRALAZINE HCL 25 MG PO TABS: 25.0000 mg | ORAL_TABLET | Freq: Two times a day (BID) | ORAL | Status: DC

## 2014-09-13 MED ORDER — POTASSIUM CHLORIDE ER 20 MEQ PO TBCR: 20.0000 meq | EXTENDED_RELEASE_TABLET | Freq: Every day | ORAL | Status: DC

## 2014-09-13 MED ORDER — FUROSEMIDE 40 MG PO TABS: 40.0000 mg | ORAL_TABLET | Freq: Every day | ORAL | Status: DC

## 2014-09-13 NOTE — Discharge Summary (Signed)
PATIENT DETAILS Name: Shelby Figueroa Age: 44 y.o. Sex: female Date of Birth: Dec 25, 1970 MRN: [id number]. Admitting Physician: Ron Parker, MD WUJ:[id number], Landis Gandy, PA-C  Admit Date: 09/10/2014 Discharge date: 09/13/2014  Recommendations for Outpatient Follow-up:  1. Please check electrolytes at next visit-started on Lasix. 2. Stop amlodipine-given worsening lower extremity edema-added Lasix and hydralazine. Please optimize blood pressure. 3. Please ensure patient follows up with pulmonology-long-standing history of pulmonary sarcoidosis-not on any medications.  4. Ace level pending-please follow 5. Likely needs outpatient pulmonary follow-up for presumed OSA and sleep study  PRIMARY DISCHARGE DIAGNOSIS:  Principal Problem:   Acute CHF Active Problems:   Diabetes mellitus without complication   Hypertension   Sarcoidosis   Asthma      PAST MEDICAL HISTORY: Past Medical History  Diagnosis Date  . Arthritis   . Asthma   . Diabetes mellitus without complication   . Hypertension   . Sarcoidosis     DISCHARGE MEDICATIONS: Current Discharge Medication List    START taking these medications   Details  furosemide (LASIX) 40 MG tablet Take 1 tablet (40 mg total) by mouth daily. Qty: 30 tablet, Refills: 0    hydrALAZINE (APRESOLINE) 25 MG tablet Take 1 tablet (25 mg total) by mouth 2 (two) times daily. Qty: 60 tablet, Refills: 0    potassium chloride 20 MEQ TBCR Take 20 mEq by mouth daily. Qty: 30 tablet, Refills: 0      CONTINUE these medications which have NOT CHANGED   Details  budesonide-formoterol (SYMBICORT) 160-4.5 MCG/ACT inhaler Inhale 2 puffs into the lungs 2 (two) times daily.    citalopram (CELEXA) 40 MG tablet Take 40 mg by mouth daily.    gabapentin (NEURONTIN) 300 MG capsule Take 300 mg by mouth 3 (three) times daily.    ibuprofen (ADVIL,MOTRIN) 200 MG tablet Take 800 mg by mouth 2 (two) times daily as needed for moderate pain.    metFORMIN  (GLUCOPHAGE) 500 MG tablet Take by mouth 2 (two) times daily with a meal.    metoprolol succinate (TOPROL-XL) 50 MG 24 hr tablet Take 50 mg by mouth daily. Take with or immediately following a meal.    mometasone-formoterol (DULERA) 100-5 MCG/ACT AERO Inhale 1 puff into the lungs 2 (two) times daily.    Multiple Vitamins tablet Take 1 tablet by mouth daily.      STOP taking these medications     amLODipine (NORVASC) 5 MG tablet         ALLERGIES:   Allergies  Allergen Reactions  . Ace Inhibitors Swelling  . Codeine Itching and Rash  . Peanuts [Peanut Oil] Itching    BRIEF HPI:  See H&P, Labs, Consult and Test reports for all details in brief, patient Shelby Figueroa is a 44 y.o. female with a history of HTN, DM2, Sarcoid and Asthma who presented to the [facility] ED with complaints of worsening SOB, and Swelling of her lower legs over the past 6 weeks. She was subsequently admitted for further evaluation and treatment  CONSULTATIONS:   None  PERTINENT RADIOLOGIC STUDIES: Dg Chest 2 View  09/10/2014   CLINICAL DATA:  45 year old female with bilateral lower extremity swelling and shortness breath for the past 6 weeks.  EXAM: CHEST  2 VIEW  COMPARISON:  No priors.  FINDINGS: There is cephalization of the pulmonary vasculature, indistinctness of the interstitial markings, and patchy airspace disease throughout the lungs bilaterally suggestive of moderate pulmonary edema. No pleural effusions. Mild cardiomegaly. Upper mediastinal contours are within normal  limits.  IMPRESSION: 1. The appearance the chest suggests congestive heart failure, although some of the interstitial prominence noted may be chronic based on comparison to prior CT of the abdomen and pelvis 07/03/2014.   Electronically Signed   By: Trudie Reed M.D.   On: 09/10/2014 17:32     PERTINENT LAB RESULTS: CBC:  Recent Labs  09/10/14 1705  WBC 7.2  HGB 11.0*  HCT 36.1  PLT 395   CMET CMP     Component Value Date/Time    NA 136 09/13/2014 0422   K 4.0 09/13/2014 0422   CL 99* 09/13/2014 0422   CO2 29 09/13/2014 0422   GLUCOSE 95 09/13/2014 0422   BUN 8 09/13/2014 0422   CREATININE 0.66 09/13/2014 0422   CALCIUM 8.9 09/13/2014 0422   GFRNONAA >60 09/13/2014 0422   GFRAA >60 09/13/2014 0422    GFR Estimated Creatinine Clearance: 136.2 mL/min (by C-G formula based on Cr of 0.66). No results for input(s): LIPASE, AMYLASE in the last 72 hours.  Recent Labs  09/10/14 1705  TROPONINI <0.03   Invalid input(s): POCBNP No results for input(s): DDIMER in the last 72 hours.  Recent Labs  09/11/14 0350  HGBA1C 6.4*   No results for input(s): CHOL, HDL, LDLCALC, TRIG, CHOLHDL, LDLDIRECT in the last 72 hours. No results for input(s): TSH, T4TOTAL, T3FREE, THYROIDAB in the last 72 hours.  Invalid input(s): FREET3 No results for input(s): VITAMINB12, FOLATE, FERRITIN, TIBC, IRON, RETICCTPCT in the last 72 hours. Coags: No results for input(s): INR in the last 72 hours.  Invalid input(s): PT Microbiology: No results found for this or any previous visit (from the past 240 hour(s)).   BRIEF HOSPITAL COURSE:  Suspected acute diastolic heart failure: Managed with IV Lasix, much improved, only trace lower extremity edema at the time of discharge. Echocardiogram showed EF of around 55%, no evidence of pulmonary hypertension. Since significantly improved, will discharge on oral Lasix. Have asked patient to follow-up with her PCP in one week for further continued care and monitoring. Because of worsening lower extremity edema, we have discontinued amlodipine.   Active Problems: Pulmonary sarcoidosis: Not on steroids, Ace level pending-please follow. Per patient, she was previously on steroids, and she has had follow-up with pulmonologist in the past at [facility]-has recently lost follow-up with pulmonology. Since this is a chronic issue, have deferred further management/workup to her PCP.  Essential hypertension:  Moderately Controlled-continue metoprolol.we'll add hydralazine on discharge. Discontinue Amlodipine as it can cause edema as well.  Type 2 diabetes: Resume metformin and discharge-CBGs controlled with SSI for the patient  Morbid obesity: Counseled regarding importance of weight loss. Likely needs outpatient pulmonary follow-up for presumed OSA and sleep study  TODAY-DAY OF DISCHARGE:  Subjective:   Shelby Figueroa today has no headache,no chest abdominal pain,no new weakness tingling or numbness, feels much better wants to go home today.   Objective:   Blood pressure 161/94, pulse 86, temperature 97.7 F (36.5 C), temperature source Oral, resp. rate 18, height 5\' 5"  (1.651 m), weight 160.029 kg (352 lb 12.8 oz), last menstrual period 08/18/2014, SpO2 95 %.  Intake/Output Summary (Last 24 hours) at 09/13/14 1035 Last data filed at 09/13/14 0855  Gross per 24 hour  Intake   1076 ml  Output   2450 ml  Net  -1374 ml   Filed Weights   09/11/14 0635 09/12/14 0501 09/13/14 0428  Weight: 161.118 kg (355 lb 3.2 oz) 161.571 kg (356 lb 3.2 oz) 160.029 kg (  352 lb 12.8 oz)    Exam Awake Alert, Oriented *3, No new F.N deficits, Normal affect Blue.AT,PERRAL Supple Neck,No JVD, No cervical lymphadenopathy appriciated.  Symmetrical Chest wall movement, Good air movement bilaterally, CTAB RRR,No Gallops,Rubs or new Murmurs, No Parasternal Heave +ve B.Sounds, Abd Soft, Non tender, No organomegaly appriciated, No rebound -guarding or rigidity. No Cyanosis, Clubbing or edema, No new Rash or bruise  DISCHARGE CONDITION: Stable  DISPOSITION: Home  DISCHARGE INSTRUCTIONS:    Activity:  As tolerated  Diet recommendation: Diabetic Diet Heart Healthy diet  Discharge Instructions    (HEART FAILURE PATIENTS) Call MD:  Anytime you have any of the following symptoms: 1) 3 pound weight gain in 24 hours or 5 pounds in 1 week 2) shortness of breath, with or without a dry hacking cough 3) swelling in  the hands, feet or stomach 4) if you have to sleep on extra pillows at night in order to breathe.    Complete by:  As directed      Diet - low sodium heart healthy    Complete by:  As directed      Increase activity slowly    Complete by:  As directed            Follow-up Information    Follow up with COPPER, JOSEPH B, PA-C. Schedule an appointment as soon as possible for a visit in 1 week.   Specialty:  Physician Assistant   Contact information:   [facility] [place] [address] [phone]       Total Time spent on discharge equals  45 minutes.  SignedJeoffrey Massed 09/13/2014 10:35 AM

## 2014-09-24 ENCOUNTER — Emergency Department (HOSPITAL_BASED_OUTPATIENT_CLINIC_OR_DEPARTMENT_OTHER): Payer: Medicaid Other

## 2014-09-24 ENCOUNTER — Emergency Department (HOSPITAL_BASED_OUTPATIENT_CLINIC_OR_DEPARTMENT_OTHER)
Admission: EM | Admit: 2014-09-24 | Discharge: 2014-09-24 | Disposition: A | Discharge status: Home / Self Care | Payer: Medicaid Other | Attending: Emergency Medicine | Admitting: Emergency Medicine

## 2014-09-24 ENCOUNTER — Encounter (HOSPITAL_BASED_OUTPATIENT_CLINIC_OR_DEPARTMENT_OTHER): Payer: Self-pay | Admitting: *Deleted

## 2014-09-24 DIAGNOSIS — Z8739 Personal history of other diseases of the musculoskeletal system and connective tissue: Secondary | ICD-10-CM | POA: Diagnosis not present

## 2014-09-24 DIAGNOSIS — Z7951 Long term (current) use of inhaled steroids: Secondary | ICD-10-CM | POA: Insufficient documentation

## 2014-09-24 DIAGNOSIS — Z862 Personal history of diseases of the blood and blood-forming organs and certain disorders involving the immune mechanism: Secondary | ICD-10-CM | POA: Insufficient documentation

## 2014-09-24 DIAGNOSIS — Z79899 Other long term (current) drug therapy: Secondary | ICD-10-CM | POA: Diagnosis not present

## 2014-09-24 DIAGNOSIS — I5031 Acute diastolic (congestive) heart failure: Secondary | ICD-10-CM | POA: Diagnosis not present

## 2014-09-24 DIAGNOSIS — J45901 Unspecified asthma with (acute) exacerbation: Secondary | ICD-10-CM | POA: Insufficient documentation

## 2014-09-24 DIAGNOSIS — E119 Type 2 diabetes mellitus without complications: Secondary | ICD-10-CM | POA: Insufficient documentation

## 2014-09-24 DIAGNOSIS — I509 Heart failure, unspecified: Secondary | ICD-10-CM

## 2014-09-24 DIAGNOSIS — R2243 Localized swelling, mass and lump, lower limb, bilateral: Secondary | ICD-10-CM | POA: Diagnosis present

## 2014-09-24 DIAGNOSIS — I10 Essential (primary) hypertension: Secondary | ICD-10-CM | POA: Insufficient documentation

## 2014-09-24 DIAGNOSIS — M79661 Pain in right lower leg: Secondary | ICD-10-CM

## 2014-09-24 HISTORY — DX: Heart failure, unspecified: I50.9

## 2014-09-24 LAB — BASIC METABOLIC PANEL
Anion gap: 7 (ref 5–15)
BUN: 8 mg/dL (ref 6–20)
CHLORIDE: 103 mmol/L (ref 101–111)
CO2: 26 mmol/L (ref 22–32)
Calcium: 8.8 mg/dL — ABNORMAL LOW (ref 8.9–10.3)
Creatinine, Ser: 0.7 mg/dL (ref 0.44–1.00)
GFR calc Af Amer: >60 mL/min (ref >60)
GFR calc non Af Amer: >60 mL/min (ref >60)
GLUCOSE: 106 mg/dL — AB (ref 65–99)
POTASSIUM: 3.7 mmol/L (ref 3.5–5.1)
SODIUM: 136 mmol/L (ref 135–145)

## 2014-09-24 LAB — CBC WITH DIFFERENTIAL/PLATELET
Basophils Absolute: 0 10*3/uL (ref 0.0–0.1)
Basophils Relative: 0 % (ref 0–1)
EOS PCT: 3 % (ref 0–5)
Eosinophils Absolute: 0.2 10*3/uL (ref 0.0–0.7)
HCT: 33.9 % — ABNORMAL LOW (ref 36.0–46.0)
HEMOGLOBIN: 10.5 g/dL — AB (ref 12.0–15.0)
LYMPHS ABS: 0.8 10*3/uL (ref 0.7–4.0)
LYMPHS PCT: 14 % (ref 12–46)
MCH: 23.2 pg — AB (ref 26.0–34.0)
MCHC: 31 g/dL (ref 30.0–36.0)
MCV: 75 fL — AB (ref 78.0–100.0)
Monocytes Absolute: 1 10*3/uL (ref 0.1–1.0)
Monocytes Relative: 17 % — ABNORMAL HIGH (ref 3–12)
Neutro Abs: 3.7 10*3/uL (ref 1.7–7.7)
Neutrophils Relative %: 66 % (ref 43–77)
PLATELETS: 352 10*3/uL (ref 150–400)
RBC: 4.52 MIL/uL (ref 3.87–5.11)
RDW: 17 % — ABNORMAL HIGH (ref 11.5–15.5)
WBC: 5.7 10*3/uL (ref 4.0–10.5)

## 2014-09-24 LAB — BRAIN NATRIURETIC PEPTIDE: B Natriuretic Peptide: 218.7 pg/mL — ABNORMAL HIGH (ref 0.0–100.0)

## 2014-09-24 MED ORDER — OXYCODONE-ACETAMINOPHEN 5-325 MG PO TABS: 1.0000 | ORAL_TABLET | ORAL | Status: AC | PRN

## 2014-09-24 MED ORDER — FUROSEMIDE 40 MG PO TABS: 40.0000 mg | ORAL_TABLET | Freq: Every day | ORAL | Status: DC

## 2014-09-24 NOTE — ED Notes (Signed)
Recent admission to [facility] for CHF. Since discharge on 8/16 pt reports she has had leg swelling. Prod Cough x 2 days and now c/o right rib pain. States she feels Sob with exertion

## 2014-09-24 NOTE — ED Notes (Signed)
Patient transported to X-ray 

## 2014-09-24 NOTE — Discharge Instructions (Signed)

## 2014-09-24 NOTE — ED Provider Notes (Signed)
CSN: [id number]     Arrival date & time 09/24/14  4098 History   First MD Initiated Contact with Patient 09/24/14 [phone]     Chief Complaint  Patient presents with  . Leg Swelling   HPI  [name] is a 44yo female presenting today for bilateral leg swelling. Swelling has been getting worse since discharge on 8/16. Has been taking medications as prescribed. Has been wearing compression stockings but has continued to swell. Now notes pain in right calf and foot.  Also notes productive cough for two days and now right rib pain. Mother also has cough. Also notes congestion. Has not been checking her weight.  Chart review shows she was recently seen on 09/10/14 for same complaint and was admitted to hospital give extreme dyspnea and hypoxia. Treated for suspected acute diastolic heart failure with lasix. Echo with EF 55%. Discharged on 09/13/14 with oral lasix and transitioned from Amlodipine to Hydralazine.   Past Medical History  Diagnosis Date  . Arthritis   . Asthma   . Diabetes mellitus without complication   . Hypertension   . Sarcoidosis   . CHF (congestive heart failure)    Past Surgical History  Procedure Laterality Date  . Achilles tendon repair Bilateral   . Knee surgery Left   . Tubal ligation      20 years ago   No family history on file. Social History  Substance Use Topics  . Smoking status: Never Smoker   . Smokeless tobacco: Never Used  . Alcohol Use: Yes     Comment: occasional   OB History    No data available     Review of Systems  Respiratory: Positive for cough and shortness of breath.   Cardiovascular: Positive for leg swelling. Negative for chest pain.  Musculoskeletal: Positive for arthralgias.   Allergies  Ace inhibitors; Codeine; and Peanuts  Home Medications   Prior to Admission medications   Medication Sig Start Date End Date Taking? Authorizing Provider  albuterol (PROVENTIL HFA;VENTOLIN HFA) 108 (90 BASE) MCG/ACT inhaler Inhale 2 puffs into  the lungs every 6 (six) hours as needed for wheezing or shortness of breath.   Yes Historical Provider, MD  citalopram (CELEXA) 40 MG tablet Take 40 mg by mouth daily.   Yes Historical Provider, MD  furosemide (LASIX) 40 MG tablet Take 1 tablet (40 mg total) by mouth daily. 09/13/14  Yes Shanker Levora Dredge, MD  gabapentin (NEURONTIN) 300 MG capsule Take 300 mg by mouth 3 (three) times daily.   Yes Historical Provider, MD  hydrALAZINE (APRESOLINE) 25 MG tablet Take 1 tablet (25 mg total) by mouth 2 (two) times daily. 09/13/14  Yes Shanker Levora Dredge, MD  ibuprofen (ADVIL,MOTRIN) 200 MG tablet Take 800 mg by mouth 2 (two) times daily as needed for moderate pain.   Yes Historical Provider, MD  metFORMIN (GLUCOPHAGE) 500 MG tablet Take by mouth 2 (two) times daily with a meal.   Yes Historical Provider, MD  metoprolol succinate (TOPROL-XL) 50 MG 24 hr tablet Take 50 mg by mouth daily. Take with or immediately following a meal.   Yes Historical Provider, MD  mometasone-formoterol (DULERA) 100-5 MCG/ACT AERO Inhale 1 puff into the lungs 2 (two) times daily.   Yes Historical Provider, MD  Multiple Vitamins tablet Take 1 tablet by mouth daily.   Yes Historical Provider, MD  potassium chloride 20 MEQ TBCR Take 20 mEq by mouth daily. 09/13/14  Yes Shanker Levora Dredge, MD  budesonide-formoterol (SYMBICORT) 160-4.5 MCG/ACT  inhaler Inhale 2 puffs into the lungs 2 (two) times daily.    Historical Provider, MD   BP 143/64 mmHg  Pulse 80  Temp(Src) 98.4 F (36.9 C) (Oral)  Resp 20  Ht 5\' 5"  (1.651 m)  Wt 349 lb (158.305 kg)  BMI 58.08 kg/m2  SpO2 93%  LMP 08/18/2014 Physical Exam  Constitutional: She appears well-developed and well-nourished. No distress.  HENT:  Head: Normocephalic and atraumatic.  Cardiovascular: Normal rate, regular rhythm and intact distal pulses.  Exam reveals no gallop and no friction rub.   No murmur heard. Pulmonary/Chest: Effort normal. No respiratory distress. She has no wheezes.   Crackles over bases bilaterally  Abdominal: Soft. She exhibits no distension. There is no tenderness.  Musculoskeletal:  +1 pitting edema noted in legs bilaterally. Tenderness over right calf and foot. Positive Homans on right.  Neurological: She is alert.  Psychiatric: She has a normal mood and affect. Her behavior is normal.    ED Course  Procedures (including critical care time) Labs Review Labs Reviewed  BASIC METABOLIC PANEL - Abnormal; Notable for the following:    Glucose, Bld 106 (*)    Calcium 8.8 (*)    All other components within normal limits  CBC WITH DIFFERENTIAL/PLATELET - Abnormal; Notable for the following:    Hemoglobin 10.5 (*)    HCT 33.9 (*)    MCV 75.0 (*)    MCH 23.2 (*)    RDW 17.0 (*)    Monocytes Relative 17 (*)    All other components within normal limits  BRAIN NATRIURETIC PEPTIDE - Abnormal; Notable for the following:    B Natriuretic Peptide 218.7 (*)    All other components within normal limits  CBC WITH DIFFERENTIAL/PLATELET    Imaging Review Dg Chest 2 View  09/24/2014   CLINICAL DATA:  Right-sided chest pain. Lower leg swelling. Recent hospitalization for congestive heart failure. Sarcoidosis. Asthma. Hypertension.  EXAM: CHEST  2 VIEW  COMPARISON:  09/10/2014 and the CT of 07/13/2014.  FINDINGS: Both views are degraded by patient morbid obesity. Lateral view is motion and position degraded as well. Midline trachea. Mild cardiomegaly. No pleural effusion or pneumothorax. Lower lobe predominant interstitial prominence is not significantly changed. No lobar consolidation.  IMPRESSION: Cardiomegaly with similar interstitial prominence compared to 09/10/2014. This is a least partially felt to be secondary to interstitial lung disease, given the 07/13/2014 CT. Superimposed mild pulmonary venous congestion is suspected.  Decreased sensitivity and specificity exam due to technique related factors, as described above.   Electronically Signed   By: Jeronimo Greaves M.D.   On: 09/24/2014 09:52   US Venous Img Lower Unilateral Right  09/24/2014   CLINICAL DATA:  RIGHT calf pain and swelling for 5 years worse over past 2 months, shortness of breath over last week, history hypertension, diabetes mellitus, CHF or sarcoidosis  EXAM: RIGHT LOWER EXTREMITY VENOUS DOPPLER ULTRASOUND  TECHNIQUE: Gray-scale sonography with graded compression, as well as color Doppler and duplex ultrasound were performed to evaluate the lower extremity deep venous systems from the level of the common femoral vein and including the common femoral, femoral, profunda femoral, popliteal and calf veins including the posterior tibial, peroneal and gastrocnemius veins when visible. The superficial great saphenous vein was also interrogated. Spectral Doppler was utilized to evaluate flow at rest and with distal augmentation maneuvers in the common femoral, femoral and popliteal veins. Image quality degraded secondary to body habitus.  COMPARISON:  None  FINDINGS: Contralateral Common Femoral Vein:  Respiratory phasicity is normal and symmetric with the symptomatic side. No evidence of thrombus. Normal compressibility.  Common Femoral Vein: No evidence of thrombus. Normal compressibility, respiratory phasicity and response to augmentation.  Saphenofemoral Junction: No evidence of thrombus. Normal compressibility and flow on color Doppler imaging.  Profunda Femoral Vein: No evidence of thrombus. Normal compressibility and flow on color Doppler imaging.  Femoral Vein: No evidence of thrombus. Normal compressibility, respiratory phasicity and response to augmentation.  Popliteal Vein: No evidence of thrombus. Normal compressibility, respiratory phasicity and response to augmentation.  Calf Veins: No evidence of thrombus. Normal compressibility and flow on color Doppler imaging.  Superficial Great Saphenous Vein: No evidence of thrombus. Normal compressibility and flow on color Doppler imaging.  Venous  Reflux:  None.  Other Findings:  Scattered soft tissue edema RIGHT calf.  IMPRESSION: No evidence of deep venous thrombosis in the RIGHT lower extremity.   Electronically Signed   By: Ulyses Southward M.D.   On: 09/24/2014 12:31   I have personally reviewed and evaluated these images and lab results as part of my medical decision-making.   EKG Interpretation   Date/Time:  Saturday September 24 2014 09:19:06 EDT Ventricular Rate:  82 PR Interval:  160 QRS Duration: 88 QT Interval:  414 QTC Calculation: 483 R Axis:   42 Text Interpretation:  Normal sinus rhythm Prolonged QT Abnormal ECG No  significant change since last tracing Confirmed by BEATON  MD, ROBERT  ([id number]) on 09/24/2014 9:20:26 AM      MDM   Final diagnoses:  Right calf pain  BMP normal except for hyperglycemia to 106; creatinine stable at 0.7. CBC normal except for anemia of 105. BNP of 218.7, stable from 225 two weeks ago. Satting in mid-90s throughout ED visit. Stable for discharge with close PCP follow up. Will increase lasix to  over the weekend and then decrease to  until PCP follow up. Suspect cough is due to virus as family has same symptoms. Return if shortness of breath worsens.     [address] [place], [place] 09/24/14 1319  Nelva Nay, MD 09/25/14 [phone]

## 2015-08-13 ENCOUNTER — Emergency Department (HOSPITAL_BASED_OUTPATIENT_CLINIC_OR_DEPARTMENT_OTHER): Payer: Medicare Other

## 2015-08-13 ENCOUNTER — Emergency Department (HOSPITAL_BASED_OUTPATIENT_CLINIC_OR_DEPARTMENT_OTHER)
Admission: EM | Admit: 2015-08-13 | Discharge: 2015-08-14 | Disposition: A | Discharge status: Home / Self Care | Payer: Medicare Other | Attending: Emergency Medicine | Admitting: Emergency Medicine

## 2015-08-13 ENCOUNTER — Encounter (HOSPITAL_BASED_OUTPATIENT_CLINIC_OR_DEPARTMENT_OTHER): Payer: Self-pay | Admitting: *Deleted

## 2015-08-13 DIAGNOSIS — D869 Sarcoidosis, unspecified: Secondary | ICD-10-CM | POA: Diagnosis not present

## 2015-08-13 DIAGNOSIS — I110 Hypertensive heart disease with heart failure: Secondary | ICD-10-CM | POA: Insufficient documentation

## 2015-08-13 DIAGNOSIS — Z79899 Other long term (current) drug therapy: Secondary | ICD-10-CM | POA: Insufficient documentation

## 2015-08-13 DIAGNOSIS — Z7984 Long term (current) use of oral hypoglycemic drugs: Secondary | ICD-10-CM | POA: Diagnosis not present

## 2015-08-13 DIAGNOSIS — J45909 Unspecified asthma, uncomplicated: Secondary | ICD-10-CM | POA: Insufficient documentation

## 2015-08-13 DIAGNOSIS — I509 Heart failure, unspecified: Secondary | ICD-10-CM | POA: Diagnosis not present

## 2015-08-13 DIAGNOSIS — R0602 Shortness of breath: Secondary | ICD-10-CM | POA: Diagnosis present

## 2015-08-13 DIAGNOSIS — E119 Type 2 diabetes mellitus without complications: Secondary | ICD-10-CM | POA: Diagnosis not present

## 2015-08-13 LAB — CBC WITH DIFFERENTIAL/PLATELET
BASOS ABS: 0 10*3/uL (ref 0.0–0.1)
BASOS PCT: 0 %
Eosinophils Absolute: 0.2 10*3/uL (ref 0.0–0.7)
Eosinophils Relative: 2 %
HEMATOCRIT: 36 % (ref 36.0–46.0)
HEMOGLOBIN: 11.3 g/dL — AB (ref 12.0–15.0)
LYMPHS ABS: 1.1 10*3/uL (ref 0.7–4.0)
LYMPHS PCT: 13 %
MCH: 23.9 pg — ABNORMAL LOW (ref 26.0–34.0)
MCHC: 31.4 g/dL (ref 30.0–36.0)
MCV: 76.3 fL — ABNORMAL LOW (ref 78.0–100.0)
Monocytes Absolute: 1.1 10*3/uL — ABNORMAL HIGH (ref 0.1–1.0)
Monocytes Relative: 13 %
NEUTROS ABS: 6.4 10*3/uL (ref 1.7–7.7)
NEUTROS PCT: 72 %
Platelets: 352 10*3/uL (ref 150–400)
RBC: 4.72 MIL/uL (ref 3.87–5.11)
RDW: 17.4 % — ABNORMAL HIGH (ref 11.5–15.5)
WBC: 8.8 10*3/uL (ref 4.0–10.5)

## 2015-08-13 NOTE — ED Notes (Addendum)
Pt reports increased SOB over the past 2 weeks.  Reports that she has seen her doctor for same.  Reports that she is having a pulmonary test done tomorrow at [facility].  Pt speaking in complete sentences without obvious distress. Pt was able to get out of the car by herself and was able to drive.

## 2015-08-13 NOTE — ED Provider Notes (Signed)
CSN: [id number]     Arrival date & time 08/13/15  2131 History  By signing my name below, I, Levon Hedger, attest that this documentation has been prepared under the direction and in the presence of Chenae Brager, MD . Electronically Signed: Levon Hedger, Scribe. 08/13/2015. 11:52 PM.   Chief Complaint  Patient presents with  . Shortness of Breath   Patient is a 45 y.o. female presenting with shortness of breath. The history is provided by the patient. No language interpreter was used.  Shortness of Breath Severity:  Moderate Onset quality:  Gradual Timing:  Constant Progression:  Unchanged Chronicity:  Chronic Context: not emotional upset   Relieved by:  Nothing Worsened by:  Nothing tried Ineffective treatments:  Diuretics Associated symptoms: no chest pain, no fever, no sore throat and no syncope   Risk factors: no prolonged immobilization   HPI Comments:  Geraldy Akridge is a 45 y.o. female with PMHx of Asthma, HTN, DM, and Sarcoidosis who presents to the Emergency Department complaining of increasing SOB onset 3 weeks ago. No alleviating factors noted. Pt has appointment with [facility] to have pulmonary test done to determine underlying cause of SOB. Pt reports associated fluid retention. No other complaints at this time.   Past Medical History  Diagnosis Date  . Arthritis   . Asthma   . Diabetes mellitus without complication (HCC)   . Hypertension   . Sarcoidosis (HCC)   . CHF (congestive heart failure) [facility])    Past Surgical History  Procedure Laterality Date  . Achilles tendon repair Bilateral   . Knee surgery Left   . Tubal ligation      20 years ago   History reviewed. No pertinent family history. Social History  Substance Use Topics  . Smoking status: Never Smoker   . Smokeless tobacco: Never Used  . Alcohol Use: Yes     Comment: occasional   OB History    No data available     Review of Systems  Constitutional: Negative for fever.  HENT: Negative  for congestion and sore throat.   Respiratory: Positive for shortness of breath. Negative for chest tightness.   Cardiovascular: Positive for leg swelling. Negative for chest pain, palpitations and syncope.  All other systems reviewed and are negative.   Allergies  Ace inhibitors; Codeine; and Peanuts  Home Medications   Prior to Admission medications   Medication Sig Start Date End Date Taking? Authorizing Provider  albuterol (PROVENTIL HFA;VENTOLIN HFA) 108 (90 BASE) MCG/ACT inhaler Inhale 2 puffs into the lungs every 6 (six) hours as needed for wheezing or shortness of breath.    Historical Provider, MD  budesonide-formoterol (SYMBICORT) 160-4.5 MCG/ACT inhaler Inhale 2 puffs into the lungs 2 (two) times daily.    Historical Provider, MD  citalopram (CELEXA) 40 MG tablet Take 40 mg by mouth daily.    Historical Provider, MD  furosemide (LASIX) 40 MG tablet Take 1 tablet (40 mg total) by mouth daily. Please take two tablets ( ) over the next two days. Then take one and a half ( ) on Monday until you can follow up with your primary doctor. 09/24/14   Harriman N Rumley, DO  gabapentin (NEURONTIN) 300 MG capsule Take 300 mg by mouth 3 (three) times daily.    Historical Provider, MD  hydrALAZINE (APRESOLINE) 25 MG tablet Take 1 tablet (25 mg total) by mouth 2 (two) times daily. 09/13/14   Shanker Levora Dredge, MD  ibuprofen (ADVIL,MOTRIN) 200 MG tablet Take 800 mg by  mouth 2 (two) times daily as needed for moderate pain.    Historical Provider, MD  metFORMIN (GLUCOPHAGE) 500 MG tablet Take by mouth 2 (two) times daily with a meal.    Historical Provider, MD  metoprolol succinate (TOPROL-XL) 50 MG 24 hr tablet Take 50 mg by mouth daily. Take with or immediately following a meal.    Historical Provider, MD  mometasone-formoterol (DULERA) 100-5 MCG/ACT AERO Inhale 1 puff into the lungs 2 (two) times daily.    Historical Provider, MD  Multiple Vitamins tablet Take 1 tablet by mouth daily.     Historical Provider, MD  oxyCODONE-acetaminophen (PERCOCET) 5-325 MG per tablet Take 1 tablet by mouth every 4 (four) hours as needed for severe pain. 09/24/14   Kittredge N Rumley, DO  potassium chloride 20 MEQ TBCR Take 20 mEq by mouth daily. 09/13/14   Shanker Levora DredgeM Ghimire, MD   BP 180/108 mmHg  Pulse 94  Temp(Src) 99 F (37.2 C) (Oral)  Resp 20  Ht 5\' 5"  (1.651 m)  Wt 375 lb (170.099 kg)  BMI 62.40 kg/m2  SpO2 97% Physical Exam  Constitutional: She is oriented to person, place, and time. She appears well-developed and well-nourished. No distress.  HENT:  Head: Normocephalic and atraumatic.  Left Ear: Tympanic membrane normal.  Mouth/Throat: Oropharynx is clear and moist. No oropharyngeal exudate.  Moist mucous membranes   Eyes: Conjunctivae are normal. Pupils are equal, round, and reactive to light.  Neck: Normal range of motion. Neck supple. No JVD present.  Trachea midline No bruit  Cardiovascular: Normal rate, regular rhythm and normal heart sounds.   Pulmonary/Chest: Effort normal and breath sounds normal. No stridor. No respiratory distress. She has no wheezes. She has no rales.  Abdominal: Soft. Bowel sounds are normal. She exhibits no distension and no mass. There is no tenderness. There is no rebound and no guarding.  Musculoskeletal: Normal range of motion. She exhibits edema.  Neurological: She is alert and oriented to person, place, and time. She has normal reflexes.  Skin: Skin is warm and dry.  Psychiatric: She has a normal mood and affect. Her behavior is normal.  Nursing note and vitals reviewed.   ED Course  Procedures  DIAGNOSTIC STUDIES:  Oxygen Saturation is 97% on [place] 2L/min, normal by my interpretation.    COORDINATION OF CARE:  11:52 AM Discussed treatment plan with pt at bedside and pt agreed to plan.  Labs Review Labs Reviewed  CBC WITH DIFFERENTIAL/PLATELET  BASIC METABOLIC PANEL  TROPONIN I    Imaging Review No results found. I have  personally reviewed and evaluated these images and lab results as part of my medical decision-making.   EKG Interpretation   Date/Time:  Sunday August 13 2015 23:21:27 EDT Ventricular Rate:  92 PR Interval:  170 QRS Duration: 86 QT Interval:  384 QTC Calculation: 474 R Axis:   46 Text Interpretation:  Normal sinus rhythm Confirmed by [facility]  MD,  Halia Franey ([id number]) on 08/13/2015 11:38:52 PM      MDM   Final diagnoses:  None   Filed Vitals:   08/14/15 0030 08/14/15 0248  BP: 146/111 134/72  Pulse: 98 90  Temp:    Resp: 22 20   Results for orders placed or performed during the hospital encounter of 08/13/15  CBC with Differential/Platelet  Result Value Ref Range   WBC 8.8 4.0 - 10.5 K/uL   RBC 4.72 3.87 - 5.11 MIL/uL   Hemoglobin 11.3 (L) 12.0 - 15.0 g/dL  HCT 36.0 36.0 - 46.0 %   MCV 76.3 (L) 78.0 - 100.0 fL   MCH 23.9 (L) 26.0 - 34.0 pg   MCHC 31.4 30.0 - 36.0 g/dL   RDW 09.8 (H) 11.9 - 14.7 %   Platelets 352 150 - 400 K/uL   Neutrophils Relative % 72 %   Neutro Abs 6.4 1.7 - 7.7 K/uL   Lymphocytes Relative 13 %   Lymphs Abs 1.1 0.7 - 4.0 K/uL   Monocytes Relative 13 %   Monocytes Absolute 1.1 (H) 0.1 - 1.0 K/uL   Eosinophils Relative 2 %   Eosinophils Absolute 0.2 0.0 - 0.7 K/uL   Basophils Relative 0 %   Basophils Absolute 0.0 0.0 - 0.1 K/uL  Basic metabolic panel  Result Value Ref Range   Sodium 136 135 - 145 mmol/L   Potassium 3.8 3.5 - 5.1 mmol/L   Chloride 102 101 - 111 mmol/L   CO2 29 22 - 32 mmol/L   Glucose, Bld 141 (H) 65 - 99 mg/dL   BUN 11 6 - 20 mg/dL   Creatinine, Ser 8.29 0.44 - 1.00 mg/dL   Calcium 9.0 8.9 - 56.2 mg/dL   GFR calc non Af Amer >60 >60 mL/min   GFR calc Af Amer >60 >60 mL/min   Anion gap 5 5 - 15  Troponin I  Result Value Ref Range   Troponin I <0.03 <0.03 ng/mL   Dg Chest 2 View  08/14/2015  CLINICAL DATA:  Shortness of breath EXAM: CHEST  2 VIEW COMPARISON:  September 24, 2014 FINDINGS: Cardiomegaly. Bilateral pulmonary  opacities identified. The opacities are similar since August of 2016. No pneumothorax. No pulmonary nodules or masses. IMPRESSION: Cardiomegaly. Opacities in the lungs, similar since August of 2016. A chronic interstitial process is suggested given the stability. No definitive overlying acute abnormality. Electronically Signed   By: Gerome Sam III M.D   On: 08/14/2015 00:55    Medications  furosemide (LASIX) injection 20 mg (20 mg Intravenous Given 08/14/15 0126)  albuterol (PROVENTIL) (2.5 MG/3ML) 0.083% nebulizer solution 2.5 mg (2.5 mg Nebulization Given 08/14/15 0120)  iopamidol (ISOVUE-370) 76 % injection 100 mL (100 mLs Intravenous Contrast Given 08/14/15 0238)    Patient does not have PE to explain her symptoms.  She is not in CHF.  I suspect that her issues are due to body habitus coupled with her sarcoidosis.  She has an appointment this am at [facility] for her pulmonary issues this morning.  She states she feels better in the ED.  Peripheral edema is improved O2 sat is 96% on room air at this time  All questions answered to parents satisfaction. Based on history and exam patient has been appropriately medically screened and emergency conditions excluded. Patient is stable for discharge at this time. Follow up provided and strict return precautions given I personally performed the services described in this documentation, which was scribed in my presence. The recorded information has been reviewed and is accurate.        Cy Blamer, MD 08/14/15 [phone]

## 2015-08-14 ENCOUNTER — Emergency Department (HOSPITAL_BASED_OUTPATIENT_CLINIC_OR_DEPARTMENT_OTHER): Payer: Medicare Other

## 2015-08-14 DIAGNOSIS — D869 Sarcoidosis, unspecified: Secondary | ICD-10-CM | POA: Diagnosis not present

## 2015-08-14 LAB — TROPONIN I: Troponin I: <0.03 ng/mL (ref <0.03)

## 2015-08-14 LAB — BASIC METABOLIC PANEL
ANION GAP: 5 (ref 5–15)
BUN: 11 mg/dL (ref 6–20)
CHLORIDE: 102 mmol/L (ref 101–111)
CO2: 29 mmol/L (ref 22–32)
Calcium: 9 mg/dL (ref 8.9–10.3)
Creatinine, Ser: 0.58 mg/dL (ref 0.44–1.00)
GFR calc non Af Amer: >60 mL/min (ref >60)
Glucose, Bld: 141 mg/dL — ABNORMAL HIGH (ref 65–99)
POTASSIUM: 3.8 mmol/L (ref 3.5–5.1)
Sodium: 136 mmol/L (ref 135–145)

## 2015-08-14 MED ORDER — FUROSEMIDE 10 MG/ML IJ SOLN
20.0000 mg | Freq: Once | INTRAMUSCULAR | Status: AC
  Administered 2015-08-14: 20 mg via INTRAVENOUS
  Filled 2015-08-14: qty 2

## 2015-08-14 MED ORDER — IOPAMIDOL (ISOVUE-370) INJECTION 76%
100.0000 mL | Freq: Once | INTRAVENOUS | Status: AC | PRN
  Administered 2015-08-14: 100 mL via INTRAVENOUS

## 2015-08-14 MED ORDER — ALBUTEROL SULFATE (2.5 MG/3ML) 0.083% IN NEBU
2.5000 mg | INHALATION_SOLUTION | Freq: Once | RESPIRATORY_TRACT | Status: AC
  Administered 2015-08-14: 2.5 mg via RESPIRATORY_TRACT
  Filled 2015-08-14: qty 3

## 2015-08-14 NOTE — Discharge Instructions (Signed)
Sarcoidosis  Sarcoidosis is a disease that causes inflammation in your organs and other areas of your body. The lungs are most often affected (pulmonary sarcoidosis). Sarcoidosis can also affect your lymph nodes, liver, eyes, skin, or any other body tissue.  When you have sarcoidosis, small clumps of tissue (granulomas) form in the affected area of your body. Granulomas are made up of your body's defense (immune) cells. Inflammation results when your body reacts to a harmful substance. Normally, inflammation goes away after immune cells get rid of the harmful substance. In sarcoidosis, the immune cells form granulomas instead.  CAUSES   The exact cause of sarcoidosis is not known. Something triggers the immune system to respond, such as dust, chemicals, bacteria, or a virus.   RISK FACTORS  You may be at a greater risk for sarcoidosis if you:   · Have a family history of the disease.  · Are African American.  · Are of Northern European ancestry.  · Are 20-50 years old.  · Are female.  SIGNS AND SYMPTOMS   Many people with sarcoidosis have no symptoms. Others have very mild symptoms. Sarcoidosis most often affects the lungs. Symptoms include:  · Chest pain.  · Coughing.  · Wheezing.  · Shortness of breath.  Other common symptoms include:   · Night sweats.  · Weight loss.  · Fatigue.  · Depression.  · A sense of uneasiness.  DIAGNOSIS   Sarcoidosis may be diagnosed by:   · Medical history and physical exam.  · Chest X-ray. This looks for granulomas in your lungs.  · Lung function tests. These measure your breathing and look for problems related to sarcoidosis.  · Examining a sample of tissue under a microscope (biopsy).  TREATMENT   Sarcoidosis usually clears up without treatment. You may take medicines to reduce inflammation or relieve symptoms. These may include:  · Prednisone. This steroid reduces inflammation related to sarcoidosis.  · Chloroquine or hydroxychloroquine. These are antimalarial medicines used to  treat sarcoidosis that affects the skin or brain.  · Methotrexate, leflunomide, or azathioprine. These medicines affect the immune system and can help with sarcoidosis in the joints, eyes, skin, or lungs.  · Inhalers. Inhaled medicines can help you breathe if sarcoidosis is affecting your lungs.  HOME CARE INSTRUCTIONS  · Do not use any tobacco products, including cigarettes, chewing tobacco, or electronic cigarettes. If you need help quitting, ask your health care provider.  · Avoid secondhand smoke.  · Avoid irritating dust and chemicals. Stay indoors on days when air quality is poor in your area.  · Take medicines only as directed by your health care provider.  SEEK MEDICAL CARE IF:  · You have vision problems.  · You have shortness of breath.  · You have a dry, persistent cough.  · You have an irregular heartbeat.  · You have pain or ache in your joints, hands, or feet.  · You have an unexplained rash.  SEEK IMMEDIATE MEDICAL CARE IF:   You have chest pain.     This information is not intended to replace advice given to you by your health care provider. Make sure you discuss any questions you have with your health care provider.     Document Released: 11/15/2003 Document Revised: 02/04/2014 Document Reviewed: 05/12/2013  Elsevier Interactive Patient Education ©2016 [employer]vier Inc[employer]

## 2015-08-14 NOTE — ED Notes (Signed)
Pt's oxygen saturations are 96% at this time on RA.

## 2015-10-09 ENCOUNTER — Inpatient Hospital Stay (HOSPITAL_BASED_OUTPATIENT_CLINIC_OR_DEPARTMENT_OTHER)
Admission: EM | Admit: 2015-10-09 | Discharge: 2015-10-12 | DRG: 202 | Disposition: A | Discharge status: Home Health | Payer: Medicare Other | Attending: Nephrology | Admitting: Nephrology

## 2015-10-09 ENCOUNTER — Encounter (HOSPITAL_BASED_OUTPATIENT_CLINIC_OR_DEPARTMENT_OTHER): Payer: Self-pay | Admitting: *Deleted

## 2015-10-09 ENCOUNTER — Emergency Department (HOSPITAL_BASED_OUTPATIENT_CLINIC_OR_DEPARTMENT_OTHER): Payer: Medicare Other

## 2015-10-09 DIAGNOSIS — R0600 Dyspnea, unspecified: Secondary | ICD-10-CM | POA: Diagnosis present

## 2015-10-09 DIAGNOSIS — Z6841 Body Mass Index (BMI) 40.0 and over, adult: Secondary | ICD-10-CM

## 2015-10-09 DIAGNOSIS — R0602 Shortness of breath: Secondary | ICD-10-CM | POA: Diagnosis present

## 2015-10-09 DIAGNOSIS — R229 Localized swelling, mass and lump, unspecified: Secondary | ICD-10-CM

## 2015-10-09 DIAGNOSIS — J45901 Unspecified asthma with (acute) exacerbation: Principal | ICD-10-CM | POA: Diagnosis present

## 2015-10-09 DIAGNOSIS — Z79899 Other long term (current) drug therapy: Secondary | ICD-10-CM

## 2015-10-09 DIAGNOSIS — E1165 Type 2 diabetes mellitus with hyperglycemia: Secondary | ICD-10-CM | POA: Diagnosis present

## 2015-10-09 DIAGNOSIS — T380X5A Adverse effect of glucocorticoids and synthetic analogues, initial encounter: Secondary | ICD-10-CM | POA: Diagnosis present

## 2015-10-09 DIAGNOSIS — D509 Iron deficiency anemia, unspecified: Secondary | ICD-10-CM | POA: Diagnosis present

## 2015-10-09 DIAGNOSIS — D860 Sarcoidosis of lung: Secondary | ICD-10-CM | POA: Diagnosis present

## 2015-10-09 DIAGNOSIS — J45909 Unspecified asthma, uncomplicated: Secondary | ICD-10-CM | POA: Diagnosis present

## 2015-10-09 DIAGNOSIS — Z7951 Long term (current) use of inhaled steroids: Secondary | ICD-10-CM

## 2015-10-09 DIAGNOSIS — E119 Type 2 diabetes mellitus without complications: Secondary | ICD-10-CM

## 2015-10-09 DIAGNOSIS — I5032 Chronic diastolic (congestive) heart failure: Secondary | ICD-10-CM | POA: Diagnosis present

## 2015-10-09 DIAGNOSIS — G4733 Obstructive sleep apnea (adult) (pediatric): Secondary | ICD-10-CM | POA: Diagnosis present

## 2015-10-09 DIAGNOSIS — R0902 Hypoxemia: Secondary | ICD-10-CM | POA: Diagnosis present

## 2015-10-09 DIAGNOSIS — I10 Essential (primary) hypertension: Secondary | ICD-10-CM | POA: Diagnosis present

## 2015-10-09 DIAGNOSIS — IMO0002 Reserved for concepts with insufficient information to code with codable children: Secondary | ICD-10-CM

## 2015-10-09 DIAGNOSIS — I110 Hypertensive heart disease with heart failure: Secondary | ICD-10-CM | POA: Diagnosis present

## 2015-10-09 DIAGNOSIS — Z7984 Long term (current) use of oral hypoglycemic drugs: Secondary | ICD-10-CM

## 2015-10-09 DIAGNOSIS — D869 Sarcoidosis, unspecified: Secondary | ICD-10-CM | POA: Diagnosis present

## 2015-10-09 HISTORY — DX: Family history of other specified conditions: Z84.89

## 2015-10-09 HISTORY — DX: Other chronic pain: G89.29

## 2015-10-09 HISTORY — DX: Pain in left knee: M25.562

## 2015-10-09 HISTORY — DX: Morbid (severe) obesity due to excess calories: E66.01

## 2015-10-09 HISTORY — DX: Headache: R51

## 2015-10-09 HISTORY — DX: Headache, unspecified: R51.9

## 2015-10-09 HISTORY — DX: Anxiety disorder, unspecified: F41.9

## 2015-10-09 HISTORY — DX: Gastro-esophageal reflux disease without esophagitis: K21.9

## 2015-10-09 HISTORY — DX: Sarcoidosis of lung: D86.0

## 2015-10-09 HISTORY — DX: Type 2 diabetes mellitus without complications: E11.9

## 2015-10-09 HISTORY — DX: Pain in right knee: M25.561

## 2015-10-09 LAB — BASIC METABOLIC PANEL
ANION GAP: 6 (ref 5–15)
BUN: 11 mg/dL (ref 6–20)
CHLORIDE: 101 mmol/L (ref 101–111)
CO2: 27 mmol/L (ref 22–32)
Calcium: 8.6 mg/dL — ABNORMAL LOW (ref 8.9–10.3)
Creatinine, Ser: 0.67 mg/dL (ref 0.44–1.00)
GFR calc Af Amer: >60 mL/min (ref >60)
GFR calc non Af Amer: >60 mL/min (ref >60)
GLUCOSE: 228 mg/dL — AB (ref 65–99)
POTASSIUM: 3.6 mmol/L (ref 3.5–5.1)
Sodium: 134 mmol/L — ABNORMAL LOW (ref 135–145)

## 2015-10-09 LAB — CBC
HEMATOCRIT: 37.2 % (ref 36.0–46.0)
HEMOGLOBIN: 11.7 g/dL — AB (ref 12.0–15.0)
MCH: 23.3 pg — AB (ref 26.0–34.0)
MCHC: 31.5 g/dL (ref 30.0–36.0)
MCV: 74 fL — ABNORMAL LOW (ref 78.0–100.0)
Platelets: 369 10*3/uL (ref 150–400)
RBC: 5.03 MIL/uL (ref 3.87–5.11)
RDW: 17.7 % — ABNORMAL HIGH (ref 11.5–15.5)
WBC: 8.5 10*3/uL (ref 4.0–10.5)

## 2015-10-09 LAB — GLUCOSE, CAPILLARY: Glucose-Capillary: 200 mg/dL — ABNORMAL HIGH (ref 65–99)

## 2015-10-09 LAB — RAPID STREP SCREEN (MED CTR MEBANE ONLY): STREPTOCOCCUS, GROUP A SCREEN (DIRECT): NEGATIVE

## 2015-10-09 LAB — TROPONIN I: Troponin I: <0.03 ng/mL (ref <0.03)

## 2015-10-09 LAB — BRAIN NATRIURETIC PEPTIDE: B Natriuretic Peptide: 90.3 pg/mL (ref 0.0–100.0)

## 2015-10-09 MED ORDER — ALBUTEROL SULFATE (2.5 MG/3ML) 0.083% IN NEBU
5.0000 mg | INHALATION_SOLUTION | Freq: Once | RESPIRATORY_TRACT | Status: AC
  Administered 2015-10-09: 5 mg via RESPIRATORY_TRACT
  Filled 2015-10-09: qty 6

## 2015-10-09 MED ORDER — ALBUTEROL SULFATE (2.5 MG/3ML) 0.083% IN NEBU
2.5000 mg | INHALATION_SOLUTION | Freq: Once | RESPIRATORY_TRACT | Status: AC
  Administered 2015-10-09: 2.5 mg via RESPIRATORY_TRACT
  Filled 2015-10-09: qty 3

## 2015-10-09 MED ORDER — ENSURE ENLIVE PO LIQD
237.0000 mL | Freq: Two times a day (BID) | ORAL | Status: DC
  Administered 2015-10-11 – 2015-10-12 (×4): 237 mL via ORAL

## 2015-10-09 MED ORDER — IPRATROPIUM BROMIDE 0.02 % IN SOLN
0.5000 mg | Freq: Once | RESPIRATORY_TRACT | Status: AC
  Administered 2015-10-09: 0.5 mg via RESPIRATORY_TRACT
  Filled 2015-10-09: qty 2.5

## 2015-10-09 MED ORDER — METHYLPREDNISOLONE SODIUM SUCC 125 MG IJ SOLR
125.0000 mg | Freq: Once | INTRAMUSCULAR | Status: AC
  Administered 2015-10-09: 125 mg via INTRAVENOUS
  Filled 2015-10-09: qty 2

## 2015-10-09 MED ORDER — IPRATROPIUM-ALBUTEROL 0.5-2.5 (3) MG/3ML IN SOLN
3.0000 mL | Freq: Once | RESPIRATORY_TRACT | Status: AC
  Administered 2015-10-09: 3 mL via RESPIRATORY_TRACT
  Filled 2015-10-09: qty 3

## 2015-10-09 NOTE — Progress Notes (Signed)
Patient arrived in the unit accompanied by two EMS personnel via stretcher. Orientation to the unit. Patient verbalizes understanding.

## 2015-10-09 NOTE — ED Provider Notes (Signed)
[facility] Provider Note   CSN: [id number] Arrival date & time: 10/09/15  1701  By signing my name below, I, Soijett Blue, attest that this documentation has been prepared under the direction and in the presence of Sharilyn SitesLisa Sanders, PA-C Electronically Signed: Soijett Blue, ED Scribe. 10/09/15. 5:39 PM.   History   Chief Complaint Chief Complaint  Patient presents with  . Shortness of Breath    HPI Cathleen Fearsngela Sailors is a 45 y.o. female with a PMHx of asthma, CHF, sarcoidosis, DM, HTN, who presents to the Emergency Department complaining of exertional SOB onset 3 days. She notes that her SOB is worsened following eating and laying flat and she sleeps propped up on two pillows at night. Pt denies having a cardiologist, but notes that her cardiac issues are managed by her PCP. Pt denies being on oxygen at home. Pt notes that recently she has had to sit down and prepare her meals, and that is unusual for her. Pt is having associated symptoms of productive cough with brown sputum, chills, bilateral lower extremity swelling x heavy and sore sensation, nausea, vomiting  yesterday, fatigue, lightheadedness due to cough, wheezing, and sore throat due to cough. Pt denies sick contacts. She notes that she has tried albuterol inhaler with her last use being yesterday for the relief of her symptoms. She denies fever and any other symptoms.  No chest pain currently.    The history is provided by the patient. No language interpreter was used.    Past Medical History:  Diagnosis Date  . Arthritis   . Asthma   . CHF (congestive heart failure) (HCC)   . Diabetes mellitus without complication (HCC)   . Hypertension   . Sarcoidosis [facility](HCC)     Patient Active Problem List   Diagnosis Date Noted  . Diabetes mellitus without complication (HCC) 09/11/2014  . Hypertension 09/11/2014  . Sarcoidosis (HCC) 09/11/2014  . Asthma 09/11/2014  . Acute CHF (HCC) 09/10/2014    Past Surgical History:    Procedure Laterality Date  . ACHILLES TENDON REPAIR Bilateral   . KNEE SURGERY Left   . TUBAL LIGATION     20 years ago    OB History    No data available       Home Medications    Prior to Admission medications   Medication Sig Start Date End Date Taking? Authorizing Provider  albuterol (PROVENTIL HFA;VENTOLIN HFA) 108 (90 BASE) MCG/ACT inhaler Inhale 2 puffs into the lungs every 6 (six) hours as needed for wheezing or shortness of breath.   Yes Historical Provider, MD  budesonide-formoterol (SYMBICORT) 160-4.5 MCG/ACT inhaler Inhale 2 puffs into the lungs 2 (two) times daily.   Yes Historical Provider, MD  citalopram (CELEXA) 40 MG tablet Take 40 mg by mouth daily.   Yes Historical Provider, MD  furosemide (LASIX) 40 MG tablet Take 1 tablet (40 mg total) by mouth daily. Please take two tablets (80mg ) over the next two days. Then take one and a half (60mg ) on Monday until you can follow up with your primary doctor. 09/24/14  Yes [place] N Rumley, DO  gabapentin (NEURONTIN) 300 MG capsule Take 300 mg by mouth 3 (three) times daily.   Yes Historical Provider, MD  hydrALAZINE (APRESOLINE) 25 MG tablet Take 1 tablet (25 mg total) by mouth 2 (two) times daily. 09/13/14  Yes Shanker Levora DredgeM Ghimire, MD  ibuprofen (ADVIL,MOTRIN) 200 MG tablet Take 800 mg by mouth 2 (two) times daily as needed for moderate pain.  Yes Historical Provider, MD  metFORMIN (GLUCOPHAGE) 500 MG tablet Take by mouth 2 (two) times daily with a meal.   Yes Historical Provider, MD  metoprolol succinate (TOPROL-XL) 50 MG 24 hr tablet Take 50 mg by mouth daily. Take with or immediately following a meal.   Yes Historical Provider, MD  metoprolol-hydrochlorothiazide (LOPRESSOR HCT) 50-25 MG tablet Take 1 tablet by mouth daily.   Yes Historical Provider, MD  Multiple Vitamins tablet Take 1 tablet by mouth daily.   Yes Historical Provider, MD  oxyCODONE-acetaminophen (PERCOCET) 5-325 MG per tablet Take 1 tablet by mouth every 4  (four) hours as needed for severe pain. 09/24/14  Yes Sedan Bonnell Public, DO    Family History No family history on file.  Social History Social History  Substance Use Topics  . Smoking status: Never Smoker  . Smokeless tobacco: Never Used  . Alcohol use Yes     Comment: occasional     Allergies   Ace inhibitors; Codeine; and Peanuts [peanut oil]   Review of Systems Review of Systems  Constitutional: Positive for chills. Negative for fever.  HENT: Positive for sore throat (due to cough).   Respiratory: Positive for cough (productive, brown sputum), shortness of breath and wheezing.   Cardiovascular: Positive for leg swelling.  Gastrointestinal: Positive for nausea and vomiting.  Neurological: Positive for light-headedness.  All other systems reviewed and are negative.    Physical Exam Updated Vital Signs BP 129/84 (BP Location: Right Wrist)   Pulse 94   Temp 98.5 F (36.9 C) (Oral)   Resp 20   Ht 5\' 5"  (1.651 m)   Wt (!) 373 lb (169.2 kg)   LMP 09/18/2015   SpO2 94%   BMI 62.07 kg/m   Physical Exam  Constitutional: She is oriented to person, place, and time. She appears well-developed and well-nourished.  HENT:  Head: Normocephalic and atraumatic.  Right Ear: Tympanic membrane, external ear and ear canal normal.  Left Ear: Tympanic membrane, external ear and ear canal normal.  Nose: Nose normal.  Mouth/Throat: Oropharynx is clear and moist. Tonsils are 1+ on the right. Tonsils are 1+ on the left. Tonsillar exudate.  Tonsils 1+ bilaterally with small exudate.   Eyes: Conjunctivae and EOM are normal. Pupils are equal, round, and reactive to light.  Neck: Normal range of motion.  Cardiovascular: Normal rate, regular rhythm and normal heart sounds.   Pulmonary/Chest: Effort normal and breath sounds normal. No respiratory distress. She has no wheezes. She has no rhonchi. She has no rales.  Slightly increased work of breathing without distress. Able to speak in  sentences without difficulty.   Abdominal: Soft. Bowel sounds are normal.  Musculoskeletal: Normal range of motion. She exhibits edema.       Right lower leg: She exhibits edema.       Left lower leg: She exhibits edema.  1+ pitting edema bilaterally. No calf pain or tenderness.   Neurological: She is alert and oriented to person, place, and time.  Skin: Skin is warm and dry.  Psychiatric: She has a normal mood and affect.  Nursing note and vitals reviewed.    ED Treatments / Results  DIAGNOSTIC STUDIES: Oxygen Saturation is 89% on RA, low by my interpretation.    COORDINATION OF CARE: 5:32 PM Discussed treatment plan with pt at bedside which includes labs, CXR, rapid strep screen, EKG, and pt agreed to plan.   Labs (all labs ordered are listed, but only abnormal results are displayed) Labs Reviewed  BASIC METABOLIC PANEL - Abnormal; Notable for the following:       Result Value   Sodium 134 (*)    Glucose, Bld 228 (*)    Calcium 8.6 (*)    All other components within normal limits  CBC - Abnormal; Notable for the following:    Hemoglobin 11.7 (*)    MCV 74.0 (*)    MCH 23.3 (*)    RDW 17.7 (*)    All other components within normal limits  RAPID STREP SCREEN (NOT AT [facility])  CULTURE, GROUP A STREP [facility])  TROPONIN I  BRAIN NATRIURETIC PEPTIDE    EKG  EKG Interpretation  Date/Time:  Monday October 09 2015 17:16:01 EDT Ventricular Rate:  99 PR Interval:    QRS Duration: 95 QT Interval:  369 QTC Calculation: 474 R Axis:   50 Text Interpretation:  Sinus rhythm Low voltage, precordial leads Borderline T abnormalities, anterior leads Baseline wander in lead(s) II III aVR aVF V1 V2 V3 V4 V5 V6 Confirmed by DELO  MD, DOUGLAS ([id number]) on 10/09/2015 5:29:47 PM       Radiology Dg Chest 2 View  Result Date: 10/09/2015 CLINICAL DATA:  Cough. History of diabetes, hypertension and sarcoidosis, CHF. EXAM: CHEST  2 VIEW COMPARISON:  Chest radiograph August 13, 2015 FINDINGS:  Cardiac silhouette is moderately enlarged, unchanged. Mediastinal silhouette is nonsuspicious. Diffusely coarsened pulmonary interstitium, similar to prior radiograph of the out pleural effusion. No pneumothorax. Large body habitus. Osseous structures are nonsuspicious. IMPRESSION: Stable cardiomegaly and similar interstitial changes. Electronically Signed   By: Awilda Metro M.D.   On: 10/09/2015 17:55    Procedures Procedures (including critical care time)  Medications Ordered in ED Medications  ipratropium-albuterol (DUONEB) 0.5-2.5 (3) MG/3ML nebulizer solution 3 mL (3 mLs Nebulization Given 10/09/15 1755)  albuterol (PROVENTIL) (2.5 MG/3ML) 0.083% nebulizer solution 2.5 mg (2.5 mg Nebulization Given 10/09/15 1755)  albuterol (PROVENTIL) (2.5 MG/3ML) 0.083% nebulizer solution 5 mg (5 mg Nebulization Given 10/09/15 1839)  methylPREDNISolone sodium succinate (SOLU-MEDROL) 125 mg/2 mL injection 125 mg (125 mg Intravenous Given 10/09/15 1839)  ipratropium (ATROVENT) nebulizer solution 0.5 mg (0.5 mg Nebulization Given 10/09/15 1839)     Initial Impression / Assessment and Plan / ED Course  I have reviewed the triage vital signs and the nursing notes.  Pertinent labs & imaging results that were available during my care of the patient were reviewed by me and considered in my medical decision making (see chart for details).  Clinical Course   45 year old female here with shortness of breath. This is been worsening over the past 3 days. She does endorse two-pillow orthopnea as well as shortness of breath with exertion. She denies any chest pain. She is afebrile and nontoxic. Her breathing is somewhat labored when she is hypoxic on room air to 89%. She does have history of sarcoidosis and ran out of her inhalers a few days ago. Patient's labs are overall reassuring. Chest x-ray with chronic changes, no acute infiltrate noted. She does have some mild pitting edema of her lower extremities, but does  not appear significantly fluid overloaded. Patient was treated here with multiple nebulizer treatments as well as Solu-Medrol without much improvement. She is still requiring 2 L supplemental oxygen. When she is removed from this, she desaturates quickly to a low of 87%. Given her new oxygen requirement, she will require admission for further management.  Patient admitted to triad service-- Dr. Toniann Fail.  Temp admit orders placed.  Final Clinical Impressions(s) / ED  Diagnoses   Final diagnoses:  Shortness of breath  Hypoxia    New Prescriptions New Prescriptions   No medications on file   I personally performed the services described in this documentation, which was scribed in my presence. The recorded information has been reviewed and is accurate.    Garlon Hatchet, PA-C 10/09/15 2141    Geoffery Lyons, MD 10/09/15 2337

## 2015-10-09 NOTE — ED Notes (Signed)
Partial to complete airway obstruction noted during HHN tx.  Restless sleep with deasts to 88% while on O2 @ 2l/m.

## 2015-10-09 NOTE — ED Triage Notes (Signed)
C/o swelling in bil legs since Friday. Cough with brown sputum.  Feels short of breath. C/o joint pain.

## 2015-10-09 NOTE — Plan of Care (Signed)
45 year old female with history of CHF and sarcoidosis presents with shortness of breath. As per ER physician patient was hypoxic in the ER requiring 2 L of oxygen to maintain sats. Cause of shortness of breath is not clear. Patient is being empirically treated for possible bronchitis with nebulizer and also started on steroids with history of sarcoidosis. Admitted for further observation.  Midge MiniumArshad Kakrakandy

## 2015-10-10 ENCOUNTER — Encounter (HOSPITAL_COMMUNITY): Payer: Self-pay | Admitting: Family Medicine

## 2015-10-10 ENCOUNTER — Observation Stay (HOSPITAL_COMMUNITY): Payer: Medicare Other

## 2015-10-10 DIAGNOSIS — T380X5A Adverse effect of glucocorticoids and synthetic analogues, initial encounter: Secondary | ICD-10-CM | POA: Diagnosis present

## 2015-10-10 DIAGNOSIS — D509 Iron deficiency anemia, unspecified: Secondary | ICD-10-CM | POA: Diagnosis present

## 2015-10-10 DIAGNOSIS — J45909 Unspecified asthma, uncomplicated: Secondary | ICD-10-CM | POA: Diagnosis not present

## 2015-10-10 DIAGNOSIS — E1165 Type 2 diabetes mellitus with hyperglycemia: Secondary | ICD-10-CM | POA: Diagnosis present

## 2015-10-10 DIAGNOSIS — G4733 Obstructive sleep apnea (adult) (pediatric): Secondary | ICD-10-CM | POA: Diagnosis present

## 2015-10-10 DIAGNOSIS — R0600 Dyspnea, unspecified: Secondary | ICD-10-CM

## 2015-10-10 DIAGNOSIS — R0602 Shortness of breath: Secondary | ICD-10-CM | POA: Diagnosis not present

## 2015-10-10 DIAGNOSIS — I5032 Chronic diastolic (congestive) heart failure: Secondary | ICD-10-CM | POA: Diagnosis not present

## 2015-10-10 DIAGNOSIS — D860 Sarcoidosis of lung: Secondary | ICD-10-CM | POA: Diagnosis present

## 2015-10-10 DIAGNOSIS — D869 Sarcoidosis, unspecified: Secondary | ICD-10-CM

## 2015-10-10 DIAGNOSIS — Z79899 Other long term (current) drug therapy: Secondary | ICD-10-CM | POA: Diagnosis not present

## 2015-10-10 DIAGNOSIS — I110 Hypertensive heart disease with heart failure: Secondary | ICD-10-CM | POA: Diagnosis present

## 2015-10-10 DIAGNOSIS — E119 Type 2 diabetes mellitus without complications: Secondary | ICD-10-CM | POA: Diagnosis not present

## 2015-10-10 DIAGNOSIS — Z6841 Body Mass Index (BMI) 40.0 and over, adult: Secondary | ICD-10-CM | POA: Diagnosis not present

## 2015-10-10 DIAGNOSIS — Z7984 Long term (current) use of oral hypoglycemic drugs: Secondary | ICD-10-CM | POA: Diagnosis not present

## 2015-10-10 DIAGNOSIS — J45901 Unspecified asthma with (acute) exacerbation: Secondary | ICD-10-CM | POA: Diagnosis present

## 2015-10-10 DIAGNOSIS — R0902 Hypoxemia: Secondary | ICD-10-CM | POA: Diagnosis present

## 2015-10-10 DIAGNOSIS — Z7951 Long term (current) use of inhaled steroids: Secondary | ICD-10-CM | POA: Diagnosis not present

## 2015-10-10 DIAGNOSIS — I10 Essential (primary) hypertension: Secondary | ICD-10-CM | POA: Diagnosis not present

## 2015-10-10 LAB — IRON AND TIBC
IRON: 27 ug/dL — AB (ref 28–170)
SATURATION RATIOS: 6 % — AB (ref 10.4–31.8)
TIBC: 427 ug/dL (ref 250–450)
UIBC: 400 ug/dL

## 2015-10-10 LAB — GLUCOSE, CAPILLARY
GLUCOSE-CAPILLARY: 254 mg/dL — AB (ref 65–99)
GLUCOSE-CAPILLARY: 287 mg/dL — AB (ref 65–99)
GLUCOSE-CAPILLARY: 189 mg/dL — AB (ref 65–99)
GLUCOSE-CAPILLARY: 365 mg/dL — AB (ref 65–99)

## 2015-10-10 LAB — HEPATIC FUNCTION PANEL
ALBUMIN: 3.3 g/dL — AB (ref 3.5–5.0)
ALT: 20 U/L (ref 14–54)
AST: 15 U/L (ref 15–41)
Alkaline Phosphatase: 74 U/L (ref 38–126)
Bilirubin, Direct: <0.1 mg/dL — ABNORMAL LOW (ref 0.1–0.5)
TOTAL PROTEIN: 8.5 g/dL — AB (ref 6.5–8.1)
Total Bilirubin: 0.5 mg/dL (ref 0.3–1.2)

## 2015-10-10 LAB — VITAMIN B12: Vitamin B-12: 774 pg/mL (ref 180–914)

## 2015-10-10 LAB — FERRITIN: Ferritin: 12 ng/mL (ref 11–307)

## 2015-10-10 MED ORDER — SODIUM CHLORIDE 0.9% FLUSH
3.0000 mL | Freq: Two times a day (BID) | INTRAVENOUS | Status: DC
  Administered 2015-10-10 – 2015-10-12 (×5): 3 mL via INTRAVENOUS

## 2015-10-10 MED ORDER — OXYCODONE-ACETAMINOPHEN 5-325 MG PO TABS
1.0000 | ORAL_TABLET | ORAL | Status: DC | PRN
  Administered 2015-10-10 (×2): 2 via ORAL
  Administered 2015-10-10: 1 via ORAL
  Administered 2015-10-11 – 2015-10-12 (×4): 2 via ORAL
  Filled 2015-10-10 (×2): qty 2
  Filled 2015-10-10: qty 1
  Filled 2015-10-10 (×4): qty 2

## 2015-10-10 MED ORDER — IBUPROFEN 600 MG PO TABS: 600.0000 mg | ORAL_TABLET | Freq: Two times a day (BID) | ORAL | Status: DC | PRN

## 2015-10-10 MED ORDER — ACETAMINOPHEN 325 MG PO TABS: 650.0000 mg | ORAL_TABLET | ORAL | Status: DC | PRN

## 2015-10-10 MED ORDER — ONDANSETRON HCL 4 MG/2ML IJ SOLN: 4.0000 mg | Freq: Four times a day (QID) | INTRAMUSCULAR | Status: DC | PRN

## 2015-10-10 MED ORDER — IPRATROPIUM-ALBUTEROL 0.5-2.5 (3) MG/3ML IN SOLN: 3.0000 mL | RESPIRATORY_TRACT | Status: DC | PRN

## 2015-10-10 MED ORDER — GABAPENTIN 300 MG PO CAPS
300.0000 mg | ORAL_CAPSULE | Freq: Three times a day (TID) | ORAL | Status: DC
  Administered 2015-10-10 – 2015-10-12 (×8): 300 mg via ORAL
  Filled 2015-10-10 (×8): qty 1

## 2015-10-10 MED ORDER — ENOXAPARIN SODIUM 80 MG/0.8ML ~~LOC~~ SOLN
80.0000 mg | SUBCUTANEOUS | Status: DC
  Administered 2015-10-10 – 2015-10-12 (×3): 80 mg via SUBCUTANEOUS
  Filled 2015-10-10 (×3): qty 0.8

## 2015-10-10 MED ORDER — HYDRALAZINE HCL 25 MG PO TABS
25.0000 mg | ORAL_TABLET | Freq: Two times a day (BID) | ORAL | Status: DC
  Administered 2015-10-10 – 2015-10-11 (×5): 25 mg via ORAL
  Filled 2015-10-10 (×5): qty 1

## 2015-10-10 MED ORDER — SODIUM CHLORIDE 0.9 % IV SOLN: 250.0000 mL | INTRAVENOUS | Status: DC | PRN

## 2015-10-10 MED ORDER — INSULIN ASPART 100 UNIT/ML ~~LOC~~ SOLN
0.0000 [IU] | Freq: Every day | SUBCUTANEOUS | Status: DC
  Administered 2015-10-10: 5 [IU] via SUBCUTANEOUS
  Administered 2015-10-11: 3 [IU] via SUBCUTANEOUS

## 2015-10-10 MED ORDER — SODIUM CHLORIDE 0.9% FLUSH: 3.0000 mL | INTRAVENOUS | Status: DC | PRN

## 2015-10-10 MED ORDER — CITALOPRAM HYDROBROMIDE 20 MG PO TABS
40.0000 mg | ORAL_TABLET | Freq: Every day | ORAL | Status: DC
Start: 2015-10-10 — End: 2015-10-12
  Administered 2015-10-10 – 2015-10-12 (×3): 40 mg via ORAL
  Filled 2015-10-10 (×3): qty 2

## 2015-10-10 MED ORDER — INSULIN ASPART 100 UNIT/ML ~~LOC~~ SOLN
0.0000 [IU] | Freq: Three times a day (TID) | SUBCUTANEOUS | Status: DC
  Administered 2015-10-10: 3 [IU] via SUBCUTANEOUS
  Administered 2015-10-10 – 2015-10-11 (×3): 8 [IU] via SUBCUTANEOUS
  Administered 2015-10-11: 5 [IU] via SUBCUTANEOUS
  Administered 2015-10-11: 8 [IU] via SUBCUTANEOUS
  Administered 2015-10-12 (×2): 11 [IU] via SUBCUTANEOUS
  Administered 2015-10-12: 5 [IU] via SUBCUTANEOUS

## 2015-10-10 MED ORDER — METHYLPREDNISOLONE SODIUM SUCC 125 MG IJ SOLR
60.0000 mg | Freq: Four times a day (QID) | INTRAMUSCULAR | Status: DC
  Administered 2015-10-10 – 2015-10-12 (×11): 60 mg via INTRAVENOUS
  Filled 2015-10-10 (×11): qty 2

## 2015-10-10 MED ORDER — FERROUS SULFATE 325 (65 FE) MG PO TABS
325.0000 mg | ORAL_TABLET | Freq: Two times a day (BID) | ORAL | Status: DC
  Administered 2015-10-10 – 2015-10-12 (×5): 325 mg via ORAL
  Filled 2015-10-10 (×5): qty 1

## 2015-10-10 MED ORDER — FUROSEMIDE 10 MG/ML IJ SOLN
40.0000 mg | Freq: Two times a day (BID) | INTRAMUSCULAR | Status: DC
  Administered 2015-10-10: 40 mg via INTRAVENOUS
  Filled 2015-10-10: qty 4

## 2015-10-10 MED ORDER — PANTOPRAZOLE SODIUM 40 MG PO TBEC
40.0000 mg | DELAYED_RELEASE_TABLET | Freq: Every day | ORAL | Status: DC
  Administered 2015-10-10 – 2015-10-12 (×3): 40 mg via ORAL
  Filled 2015-10-10 (×4): qty 1

## 2015-10-10 MED ORDER — FUROSEMIDE 40 MG PO TABS
40.0000 mg | ORAL_TABLET | Freq: Two times a day (BID) | ORAL | Status: DC
  Administered 2015-10-10 – 2015-10-12 (×5): 40 mg via ORAL
  Filled 2015-10-10 (×5): qty 1

## 2015-10-10 MED ORDER — METOPROLOL SUCCINATE ER 50 MG PO TB24
50.0000 mg | ORAL_TABLET | Freq: Every day | ORAL | Status: DC
  Administered 2015-10-10 – 2015-10-12 (×3): 50 mg via ORAL
  Filled 2015-10-10 (×3): qty 1

## 2015-10-10 MED ORDER — MONTELUKAST SODIUM 10 MG PO TABS
10.0000 mg | ORAL_TABLET | Freq: Every day | ORAL | Status: DC
  Administered 2015-10-10 – 2015-10-11 (×2): 10 mg via ORAL
  Filled 2015-10-10 (×2): qty 1

## 2015-10-10 MED ORDER — INSULIN GLARGINE 100 UNIT/ML ~~LOC~~ SOLN
20.0000 [IU] | Freq: Every day | SUBCUTANEOUS | Status: DC
  Administered 2015-10-10 – 2015-10-12 (×3): 20 [IU] via SUBCUTANEOUS
  Filled 2015-10-10 (×3): qty 0.2

## 2015-10-10 MED ORDER — ADULT MULTIVITAMIN W/MINERALS CH
1.0000 | ORAL_TABLET | Freq: Every day | ORAL | Status: DC
  Administered 2015-10-10 – 2015-10-12 (×3): 1 via ORAL
  Filled 2015-10-10 (×3): qty 1

## 2015-10-10 NOTE — Progress Notes (Signed)
   10/10/15 1300  Clinical Encounter Type  Visited With Patient  Visit Type Initial  Referral From Patient (Ancillary Consult)  Stress Factors  Patient Stress Factors Health changes (w/ self & son.)   Met w/ pt.   - reflected pt. sources of hope and meaning   -God (christian)   - Family (mother & son).   Will follow up, as needed.  - Rev. [place] MDiv ThM

## 2015-10-10 NOTE — Progress Notes (Signed)
Nutrition Brief Note  Patient identified on the Malnutrition Screening Tool (MST) Report  Wt Readings from Last 15 Encounters:  10/10/15 (!) 382 lb 3.2 oz (173.4 kg)  08/13/15 (!) 375 lb (170.1 kg)  09/24/14 (!) 349 lb (158.3 kg)  09/13/14 (!) 352 lb 12.8 oz (160 kg)    Body mass index is 63.6 kg/m. Patient meets criteria for Morbid Obesity based on current BMI. Pt asleep at time of visit. There is no evidence of significant weight loss per weight history. Pt has history of CHF and HTN and presented with shortness of breath. RD left "Low Sodium Nutrition Therapy" handout from the Academy of [employer] and Dietetics.   Current diet order is Heart Healthy, patient is consuming approximately 100% of meals at this time. Labs and medications reviewed.   No nutrition interventions warranted at this time. Will follow-up at later date to assess any nutrition questions or concerns pt may have.    Dorothea Ogleeanne Katlin Bortner RD, LDN, CSP Inpatient Clinical Dietitian Pager: [phone] After Hours Pager: [phone]

## 2015-10-10 NOTE — H&P (Signed)
History and Physical    Shelby Figueroa AVW:[id number] DOB: 09-14-1970 DOA: 10/09/2015  PCP: Pcp Not In System   Patient coming from: Home, by way of [facility]   Chief Complaint: Dyspnea   HPI: Shelby Figueroa is a 45 y.o. female with medical history significant for obesity with BMI 64, asthma, pulmonary sarcoidosis, chronic diastolic CHF, hypertension, and type 2 diabetes mellitus presents in transfer from [facility] where she had presented with 4 days of progressive exertional dyspnea and cough. Patient reports that she had been in her usual state of health until approximately 4 days ago when she noted increased increase in exertional dyspnea with concomitant edema in the bilateral lower extremity and developed a cough productive of thick brown sputum. There has been no fevers or chills during this interval and the patient reports that she has been losing weight, but reports her current weight as 350 pounds while the hospital scale is reading 382. She denies any associated chest pain, palpitations, nausea, or diaphoresis. She reports chronic orthopnea and states that this is unchanged. She denies unilateral leg swelling, tenderness, or personal or family history of VTE.  [facility] ED Course: Upon arrival to the [facility] ED, patient is found to be afebrile, saturating in the high 80s on room air, hypertensive, and borderline tachycardic. EKG feature to sinus rhythm with T-wave flattening in the anterior leads. Chest x-ray featured stable cardiomegaly and chronic interstitial changes. Chemistry panel was notable only for a serum glucose of 228 and CBC features a stable microcytic anemia. Troponin is undetectable and BNP is within the normal limits at 90. Oxygen saturations normalized with administration of 2 L/m supplemental oxygen. Patient was treated with duo nebs and 125 mg IV Solu-Medrol in the ED. Patient remained hemodynamically stable the emergency department and is now admitted to [facility]  telemetry unit for ongoing evaluation and management of exertional dyspnea suspected to be multifactorial.  Review of Systems:  All other systems reviewed and apart from HPI, are negative.  Past Medical History:  Diagnosis Date  . Anxiety   . Arthritis    "qwhere" (10/09/2015)  . Asthma   . Bilateral chronic knee pain   . CHF (congestive heart failure) (HCC)   . Family history of adverse reaction to anesthesia    "my mother had anesthesia for OHS; woke up 6 days later" (10/09/2015)  . GERD (gastroesophageal reflux disease)   . Headache    "weekly" (10/09/2015)  . Hypertension   . Morbid obesity (HCC)   . Sarcoidosis of lung (HCC)   . Type II diabetes mellitus (HCC)     Past Surgical History:  Procedure Laterality Date  . ACHILLES TENDON REPAIR Bilateral 2007  . KNEE ARTHROSCOPY Left 04/2006  . TUBAL LIGATION  1995     reports that she has never smoked. She has never used smokeless tobacco. She reports that she drinks alcohol. She reports that she does not use drugs.  Allergies  Allergen Reactions  . Ace Inhibitors Swelling  . Codeine Itching and Rash  . Peanuts [Peanut Oil] Itching    History reviewed. No pertinent family history.   Prior to Admission medications   Medication Sig Start Date End Date Taking? Authorizing Provider  albuterol (PROVENTIL HFA;VENTOLIN HFA) 108 (90 BASE) MCG/ACT inhaler Inhale 2 puffs into the lungs every 6 (six) hours as needed for wheezing or shortness of breath.   Yes Historical Provider, MD  budesonide-formoterol (SYMBICORT) 160-4.5 MCG/ACT inhaler Inhale 2 puffs into the lungs 2 (two)  times daily.   Yes Historical Provider, MD  citalopram (CELEXA) 40 MG tablet Take 40 mg by mouth daily.   Yes Historical Provider, MD  furosemide (LASIX) 40 MG tablet Take 1 tablet (40 mg total) by mouth daily. Please take two tablets (80mg ) over the next two days. Then take one and a half (60mg ) on Monday until you can follow up with your primary doctor.  09/24/14  Yes Holden N Rumley, DO  gabapentin (NEURONTIN) 300 MG capsule Take 300 mg by mouth 3 (three) times daily.   Yes Historical Provider, MD  hydrALAZINE (APRESOLINE) 25 MG tablet Take 1 tablet (25 mg total) by mouth 2 (two) times daily. 09/13/14  Yes Shanker Levora Dredge, MD  ibuprofen (ADVIL,MOTRIN) 200 MG tablet Take 800 mg by mouth 2 (two) times daily as needed for moderate pain.   Yes Historical Provider, MD  metFORMIN (GLUCOPHAGE) 500 MG tablet Take by mouth 2 (two) times daily with a meal.   Yes Historical Provider, MD  metoprolol succinate (TOPROL-XL) 50 MG 24 hr tablet Take 50 mg by mouth daily. Take with or immediately following a meal.   Yes Historical Provider, MD  metoprolol-hydrochlorothiazide (LOPRESSOR HCT) 50-25 MG tablet Take 1 tablet by mouth daily.   Yes Historical Provider, MD  Multiple Vitamins tablet Take 1 tablet by mouth daily.   Yes Historical Provider, MD  oxyCODONE-acetaminophen (PERCOCET) 5-325 MG per tablet Take 1 tablet by mouth every 4 (four) hours as needed for severe pain. 09/24/14  Yes [place] Bonnell Public, DO    Physical Exam: Vitals:   10/09/15 2100 10/09/15 2130 10/09/15 2227 10/10/15 0042  BP: 133/90 133/90 (!) 144/91   Pulse: 92 86 88 87  Resp: 19 18 20 16   Temp:   98.7 F (37.1 C)   TempSrc:   Oral   SpO2: 92% 94% 94% 94%  Weight:   (!) 173.5 kg (382 lb 9.6 oz)   Height:   5\' 5"  (1.651 m)       Constitutional: NAD, calm, comfortable, very obese Eyes: PERTLA, lids and conjunctivae normal ENMT: Mucous membranes are moist. Posterior pharynx clear of any exudate or lesions.   Neck: normal, supple, no masses, no thyromegaly Respiratory: Diminished bilateraly. Normal respiratory effort. No accessory muscle use.  Cardiovascular: S1 & S2 heard, regular rate and rhythm. 2+ pedal pulses. No carotid bruits.   Abdomen: No distension, no tenderness, soft, non-tender mass in RUQ. Bowel sounds normal.  Musculoskeletal: no clubbing / cyanosis. No joint  deformity upper and lower extremities. Normal muscle tone.  Skin: no significant rashes, lesions, ulcers. Warm, dry, well-perfused. Neurologic: CN 2-12 grossly intact. Sensation intact, DTR normal. Strength 5/5 in all 4 limbs.  Psychiatric: Normal judgment and insight. Alert and oriented x 3. Normal mood and affect.     Labs on Admission: I have personally reviewed following labs and imaging studies  CBC:  Recent Labs Lab 10/09/15 1720  WBC 8.5  HGB 11.7*  HCT 37.2  MCV 74.0*  PLT 369   Basic Metabolic Panel:  Recent Labs Lab 10/09/15 1720  NA 134*  K 3.6  CL 101  CO2 27  GLUCOSE 228*  BUN 11  CREATININE 0.67  CALCIUM 8.6*   GFR: Estimated Creatinine Clearance: 142.2 mL/min (by C-G formula based on SCr of 0.8 mg/dL). Liver Function Tests: No results for input(s): AST, ALT, ALKPHOS, BILITOT, PROT, ALBUMIN in the last 168 hours. No results for input(s): LIPASE, AMYLASE in the last 168 hours. No results for input(s):  AMMONIA in the last 168 hours. Coagulation Profile: No results for input(s): INR, PROTIME in the last 168 hours. Cardiac Enzymes:  Recent Labs Lab 10/09/15 1720  TROPONINI <0.03   BNP (last 3 results) No results for input(s): PROBNP in the last 8760 hours. HbA1C: No results for input(s): HGBA1C in the last 72 hours. CBG:  Recent Labs Lab 10/09/15 2238  GLUCAP 200*   Lipid Profile: No results for input(s): CHOL, HDL, LDLCALC, TRIG, CHOLHDL, LDLDIRECT in the last 72 hours. Thyroid Function Tests: No results for input(s): TSH, T4TOTAL, FREET4, T3FREE, THYROIDAB in the last 72 hours. Anemia Panel: No results for input(s): VITAMINB12, FOLATE, FERRITIN, TIBC, IRON, RETICCTPCT in the last 72 hours. Urine analysis:    Component Value Date/Time   COLORURINE YELLOW 09/10/2014 1647   APPEARANCEUR CLOUDY (A) 09/10/2014 1647   LABSPEC 1.021 09/10/2014 1647   PHURINE 6.5 09/10/2014 1647   GLUCOSEU NEGATIVE 09/10/2014 1647   HGBUR NEGATIVE  09/10/2014 1647   BILIRUBINUR NEGATIVE 09/10/2014 1647   KETONESUR NEGATIVE 09/10/2014 1647   PROTEINUR NEGATIVE 09/10/2014 1647   UROBILINOGEN 2.0 (H) 09/10/2014 1647   NITRITE NEGATIVE 09/10/2014 1647   LEUKOCYTESUR NEGATIVE 09/10/2014 1647   Sepsis Labs: @LABRCNTIP (procalcitonin:4,lacticidven:4) ) Recent Results (from the past 240 hour(s))  Rapid strep screen     Status: None   Collection Time: 10/09/15  5:30 PM  Result Value Ref Range Status   Streptococcus, Group A Screen (Direct) NEGATIVE NEGATIVE Final    Comment: (NOTE) A Rapid Antigen test may result negative if the antigen level in the sample is below the detection level of this test. The FDA has not cleared this test as a stand-alone test therefore the rapid antigen negative result has reflexed to a Group A Strep culture.      Radiological Exams on Admission: Dg Chest 2 View  Result Date: 10/09/2015 CLINICAL DATA:  Cough. History of diabetes, hypertension and sarcoidosis, CHF. EXAM: CHEST  2 VIEW COMPARISON:  Chest radiograph August 13, 2015 FINDINGS: Cardiac silhouette is moderately enlarged, unchanged. Mediastinal silhouette is nonsuspicious. Diffusely coarsened pulmonary interstitium, similar to prior radiograph of the out pleural effusion. No pneumothorax. Large body habitus. Osseous structures are nonsuspicious. IMPRESSION: Stable cardiomegaly and similar interstitial changes. Electronically Signed   By: Awilda Metroourtnay  Bloomer M.D.   On: 10/09/2015 17:55    EKG: Independently reviewed. Sinus rhythm, T-wave flattening in anterior leads  Assessment/Plan  1. Exertional dyspnea  - Uncertain etiology, likely multifactorial  - Requiring 2 Lpm supplemental oxygen to maintain saturations in 90's  - Pt reports recent wt loss (though EMR suggests the opposite) and BNP is low  - No infiltrate on CXR  - Diminished on exam, but no wheezing on her arrival here  - [facility]  - Continue DuoNebs prn, systemic steroids with Solu-Medrol 60 mg q6h  - Continuous pulse oximetry with titration of FiO2 to maintain sat >92%  - Consider TTE and/or CT chest for further evaluation   2. Chronic diastolic CHF  - TTE (09/02/14) with EF 55%, mild LVH, mild LAE, no significant valvular disease - Body habitus makes clinical determination of fluid-status difficult - BNP is 90 on admission, had previously been in 200's during suspected CHF exacerbation  - Managed with Lasix 40 mg BID at home, increased to 40 mg IV q12h for now  - Follow daily wts and strict I/O's, SLIV, and fluid-restrict diet    3. Asthma  -  Breath sounds diminished on arrival to [facility], but no wheezing appreciated  - Continue systemic steroids and prn DuoNeb as above    4. Sarcoidosis, pulmonary  - Pt reports that disease has been quiescent for a long time and she has been off treatment  - Currently managing with systemic steroid as above    5. OSA - Continue CPAP qHS   6. Microcytic anemia  - Hgb 11.7 on admission with MCV 74.0 - Both indices stable relative to priors and there is no suggestion of active blood-loss on admission  - Check iron studies, B12, folate, and supplement as needed   7. Hypertension  - At goal currently  - Continue current management with metoprolol, hydralazine, and Lasix    8. Type II DM  - A1c 6.4% in August 2016  - Managed with metformin only at home, will be held while in hospital  - Check CBG with meals and qHS  - Start with moderate-intensity sliding-scale correctional and adjust prn    DVT prophylaxis: sq Lovenox  Code Status: Full  Family Communication: Discussed with patient Disposition Plan: Observe on telemetry Consults called: None Admission status: Observation   Briscoe Deutscher, MD [facility] Pager [phone]  If 7PM-7AM, please contact night-coverage [email or web address] Password [facility]  10/10/2015, 12:58 AM

## 2015-10-10 NOTE — Progress Notes (Addendum)
PROGRESS NOTE                                                                                                                                                                                                             Patient Demographics:    Shelby Figueroa, is a 45 y.o. female, DOB - [birth year], ZOX:[id number]  Admit date - 10/09/2015   Admitting Physician Briscoe Deutscher, MD  Outpatient Primary MD for the patient is Pcp Not In System  LOS - 0  Chief Complaint  Patient presents with  . Shortness of Breath       Brief Narrative  45 y.o. female with medical history significant for obesity with BMI 64, asthma, pulmonary sarcoidosis, chronic diastolic CHF, hypertension, and type 2 diabetes mellitus presents in transfer from [facility] where she had presented with 4 days of progressive exertional dyspnea and cough, she was admitted for mild URI causing asthma exacerbation. Has shown remarkable improvement to IV steroids and supportive care.   Subjective:    Shelby Figueroa today has, No headache, No chest pain, Intermittent chronic right upper quadrant abdominal pain - No Nausea, No new weakness tingling or numbness, Which improved cough and shortness of breath   Assessment  & Plan :      1.Exertional shortness of breath along with cough and wheezing. Does have history of asthma long with sarcoidosis, chest x-ray is unremarkable, she is afebrile, minimal to no wheezing currently on exam, has responded very well to IV Solu-Medrol along with nebulizer treatments, continue supportive care, increase activity and try to titrate off oxygen.  2. Chronic diastolic CHF EF 55%. Appears compensated, BNP is normal, will resume home dose diuretic.  3. Pulmonary sarcoidosis. Chest x-ray changes consistent with chronic disease, she is not on oxygen, follows with [facility], will have her follow with them post  discharge.  4. Mid obesity with OSA. Compliant with CPAP continue. Follow with PCP for weight loss.  5. Microcytic anemia. Iron studies suggestive of iron deficiency not place on oral iron and thereafter outpatient age-appropriate workup by PCP.  6. Hypertension. Stable on Lopressor and hydralazine.  7. GERD. On PPI.  8. Chronic intermittent right upper quadrant pain. Liver enzymes are stable, exam is benign, right upper quadrant ultrasound ordered upon admission will be followed. Do not think there  is any acute pathology involved.   9. DM type II on sliding scale insulin, will add Lantus while she is on steroids, monitor closely while she is on steroids. Hold Glucophage.  CBG (last 3)   Recent Labs  10/09/15 2238 10/10/15 0557  GLUCAP 200* 254*      Family Communication  :  None  Code Status :  Full  Diet : Heart M.D., low carbohydrate  Disposition Plan  :  Home in 1-2 days  Consults  :  None  Procedures  :     DVT Prophylaxis  :  Lovenox   Lab Results  Component Value Date   PLT 369 10/09/2015    Inpatient Medications  Scheduled Meds: . citalopram  40 mg Oral Daily  . enoxaparin (LOVENOX) injection  80 mg Subcutaneous Q24H  . feeding supplement (ENSURE ENLIVE)  237 mL Oral BID BM  . furosemide  40 mg Intravenous Q12H  . gabapentin  300 mg Oral TID  . hydrALAZINE  25 mg Oral BID  . insulin aspart  0-15 Units Subcutaneous TID WC  . insulin aspart  0-5 Units Subcutaneous QHS  . methylPREDNISolone (SOLU-MEDROL) injection  60 mg Intravenous Q6H  . metoprolol succinate  50 mg Oral Daily  . multivitamin with minerals  1 tablet Oral Daily  . pantoprazole  40 mg Oral Daily  . sodium chloride flush  3 mL Intravenous Q12H   Continuous Infusions:  PRN Meds:.sodium chloride, acetaminophen, ibuprofen, ipratropium-albuterol, ondansetron (ZOFRAN) IV, oxyCODONE-acetaminophen, sodium chloride flush  Antibiotics  :    Anti-infectives    None         Objective:    Vitals:   10/09/15 2227 10/10/15 0042 10/10/15 0338 10/10/15 0828  BP: (!) 144/91  (!) 140/92 (!) 159/76  Pulse: 88 87  75  Resp: 20 16 18 20   Temp: 98.7 F (37.1 C)  97.9 F (36.6 C) 98.2 F (36.8 C)  TempSrc: Oral  Oral Oral  SpO2: 94% 94% 93% 94%  Weight: (!) 173.5 kg (382 lb 9.6 oz)  (!) 173.4 kg (382 lb 3.2 oz)   Height: 5\' 5"  (1.651 m)       Wt Readings from Last 3 Encounters:  10/10/15 (!) 173.4 kg (382 lb 3.2 oz)  08/13/15 (!) 170.1 kg (375 lb)  09/24/14 (!) 158.3 kg (349 lb)     Intake/Output Summary (Last 24 hours) at 10/10/15 1035 Last data filed at 10/10/15 0859  Gross per 24 hour  Intake              480 ml  Output              300 ml  Net              180 ml     Physical Exam  Awake Alert, Oriented X 3, No new F.N deficits, Normal affect [place].AT,PERRAL Supple Neck,No JVD, No cervical lymphadenopathy appriciated.  Symmetrical Chest wall movement, Good air movement bilaterally, CTAB RRR,No Gallops,Rubs or new Murmurs, No Parasternal Heave +ve B.Sounds, Abd Soft, No tenderness, No organomegaly appriciated, No rebound - guarding or rigidity. No Cyanosis, Clubbing or edema, No new Rash or bruise       Data Review:    CBC  Recent Labs Lab 10/09/15 1720  WBC 8.5  HGB 11.7*  HCT 37.2  PLT 369  MCV 74.0*  MCH 23.3*  MCHC 31.5  RDW 17.7*    Chemistries   Recent Labs Lab 10/09/15 1720 10/10/15  0646  NA 134*  --   K 3.6  --   CL 101  --   CO2 27  --   GLUCOSE 228*  --   BUN 11  --   CREATININE 0.67  --   CALCIUM 8.6*  --   AST  --  15  ALT  --  20  ALKPHOS  --  74  BILITOT  --  0.5   ------------------------------------------------------------------------------------------------------------------ No results for input(s): CHOL, HDL, LDLCALC, TRIG, CHOLHDL, LDLDIRECT in the last 72 hours.  Lab Results  Component Value Date   HGBA1C 6.4 (H) 09/11/2014    ------------------------------------------------------------------------------------------------------------------ No results for input(s): TSH, T4TOTAL, T3FREE, THYROIDAB in the last 72 hours.  Invalid input(s): FREET3 ------------------------------------------------------------------------------------------------------------------  Recent Labs  10/10/15 0646  VITAMINB12 774  FERRITIN 12  TIBC 427  IRON 27*    Coagulation profile No results for input(s): INR, PROTIME in the last 168 hours.  No results for input(s): DDIMER in the last 72 hours.  Cardiac Enzymes  Recent Labs Lab 10/09/15 1720  TROPONINI <0.03   ------------------------------------------------------------------------------------------------------------------    Component Value Date/Time   BNP 90.3 10/09/2015 1720    Micro Results Recent Results (from the past 240 hour(s))  Rapid strep screen     Status: None   Collection Time: 10/09/15  5:30 PM  Result Value Ref Range Status   Streptococcus, Group A Screen (Direct) NEGATIVE NEGATIVE Final    Comment: (NOTE) A Rapid Antigen test may result negative if the antigen level in the sample is below the detection level of this test. The FDA has not cleared this test as a stand-alone test therefore the rapid antigen negative result has reflexed to a Group A Strep culture.     Radiology Reports Dg Chest 2 View  Result Date: 10/09/2015 CLINICAL DATA:  Cough. History of diabetes, hypertension and sarcoidosis, CHF. EXAM: CHEST  2 VIEW COMPARISON:  Chest radiograph August 13, 2015 FINDINGS: Cardiac silhouette is moderately enlarged, unchanged. Mediastinal silhouette is nonsuspicious. Diffusely coarsened pulmonary interstitium, similar to prior radiograph of the out pleural effusion. No pneumothorax. Large body habitus. Osseous structures are nonsuspicious. IMPRESSION: Stable cardiomegaly and similar interstitial changes. Electronically Signed   By: Awilda Metro M.D.   On: 10/09/2015 17:55    Time Spent in minutes  30   SINGH,PRASHANT K M.D on 10/10/2015 at 10:35 AM  Between 7am to 7pm - Pager - [phone]  After 7pm go to [email or web address] - password [facility]  [facility] -  Office  [phone]

## 2015-10-10 NOTE — Care Management Obs Status (Signed)
MEDICARE OBSERVATION STATUS NOTIFICATION   Patient Details  Name: Shelby Figueroa MRN: [id number] Date of Birth: 1970/04/20   Medicare Observation Status Notification Given:  Yes    Antony HasteBennett, Anabeth Chilcott Harris, RN 10/10/2015, 8:33 PM

## 2015-10-10 NOTE — Care Management Note (Addendum)
Case Management Note  Patient Details  Name: Shelby Figueroa MRN: [id number] Date of Birth: 07-20-1970  Subjective/Objective:     Admitted with SOB/URI/ asthma excerbation, hx of obesity , asthma, pulmonary sarcoidosis, chronic diastolic CHF, hypertension, and type 2 diabetes mellitus. From home with family. Pt states no usage of assistive devices  and independent with ADL's PTA.       PCP:  Leanna Sator.Brook Varnum, [facility], [phone]  Action/Plan: Return to home when medically stable. CM to f/u with disposition needs.  Expected Discharge Date:                  Expected Discharge Plan:  Home/Self Care (Resides with 3 son in mom's home [facility]  In-House Referral:     Discharge planning Services  CM Consult  Post Acute Care Choice:    Choice offered to:     DME Arranged:    DME Agency:     HH Arranged:    HH Agency:     Status of Service:  In process, will continue to follow  If discussed at Long Length of Stay Meetings, dates discussed:    Additional Comments:  Epifanio LeschesCole, Kadajah Hudson, RN 10/10/2015, 12:51 PM

## 2015-10-11 DIAGNOSIS — E119 Type 2 diabetes mellitus without complications: Secondary | ICD-10-CM

## 2015-10-11 DIAGNOSIS — I5032 Chronic diastolic (congestive) heart failure: Secondary | ICD-10-CM

## 2015-10-11 DIAGNOSIS — R0602 Shortness of breath: Secondary | ICD-10-CM

## 2015-10-11 LAB — COMPREHENSIVE METABOLIC PANEL
ALT: 22 U/L (ref 14–54)
AST: 20 U/L (ref 15–41)
Albumin: 3.2 g/dL — ABNORMAL LOW (ref 3.5–5.0)
Alkaline Phosphatase: 68 U/L (ref 38–126)
Anion gap: 8 (ref 5–15)
BILIRUBIN TOTAL: 0.5 mg/dL (ref 0.3–1.2)
BUN: 11 mg/dL (ref 6–20)
CO2: 26 mmol/L (ref 22–32)
CREATININE: 0.76 mg/dL (ref 0.44–1.00)
Calcium: 9.1 mg/dL (ref 8.9–10.3)
Chloride: 99 mmol/L — ABNORMAL LOW (ref 101–111)
Glucose, Bld: 288 mg/dL — ABNORMAL HIGH (ref 65–99)
POTASSIUM: 4.4 mmol/L (ref 3.5–5.1)
Sodium: 133 mmol/L — ABNORMAL LOW (ref 135–145)
TOTAL PROTEIN: 8.2 g/dL — AB (ref 6.5–8.1)

## 2015-10-11 LAB — GLUCOSE, CAPILLARY
GLUCOSE-CAPILLARY: 227 mg/dL — AB (ref 65–99)
GLUCOSE-CAPILLARY: 277 mg/dL — AB (ref 65–99)
Glucose-Capillary: 278 mg/dL — ABNORMAL HIGH (ref 65–99)
Glucose-Capillary: 294 mg/dL — ABNORMAL HIGH (ref 65–99)

## 2015-10-11 LAB — CULTURE, GROUP A STREP (THRC)

## 2015-10-11 LAB — FOLATE RBC
FOLATE, HEMOLYSATE: 476.2 ng/mL
Folate, RBC: 1323 ng/mL (ref >498)
Hematocrit: 36 % (ref 34.0–46.6)

## 2015-10-11 LAB — HEMOGLOBIN A1C
HEMOGLOBIN A1C: 7.9 % — AB (ref 4.8–5.6)
MEAN PLASMA GLUCOSE: 180 mg/dL

## 2015-10-11 NOTE — Progress Notes (Signed)
PROGRESS NOTE    Shelby Figueroa  ZOX:[id number] DOB: 1970-12-03 DOA: 10/09/2015 PCP: Pcp Not In System   Brief Narrative: 45 y.o.femalewith medical history significant for obesity with BMI 64, asthma, pulmonary sarcoidosis, chronic diastolic CHF, hypertension, and type 2 diabetes mellitus transferred from [facility] where she had presented with 4 days of progressive exertional dyspnea and cough. She was admitted for mild URI causing asthma exacerbation. Has shown remarkable improvement to IV steroids and supportive care.  Assessment & Plan:   # Exertional  Shortness of breath associated with cough and wheeze: Possible asthma exacerbation. Pt has history of sarcoidosis. Chest x-ray unremarkable. Patient seems responding with IV Solu-Medrol. Patient still has mild wheezing and SOB. I will continue solumedrol today with plan to change to oral prednisone tomorrow.  -wean off oxygen gradually -continue bronchodilators and breathing treatment.   # Chronic diastolic CHF (congestive heart failure) (HCC): LVEF 55 %. Appears compensated. On home dose of lasix.    #Diabetes mellitus type II, non insulin dependent (HCC): Uncontrolled in the setting of steroid use. Continue current insulin regimen. Monitor blood sugar level.   # Hypertension: BP sub-optimal control. On metoprolol, hydralazine and lasix. Monitor BP.   # h/o Pulmonary Sarcoidosis [facility](HCC): [facility], advised to continue outpatient follow up.   # Mild obesity with  OSA (obstructive sleep apnea): CPAP  DVT prophylaxis: Lovenox sq Code Status:Full Family Communication:None Disposition Plan:Likely discharge home in 1-2 days.   Consultants:   none   Subjective: Patient was seen and examined at the bedside. Patient reported shortness of breath high a pressure than yesterday. She has dry cough. Denied chest pain, nausea, vomiting, headache.   Objective: Vitals:   10/11/15 0856 10/11/15 1116  10/11/15 1126 10/11/15 1500  BP: 132/74 (!) 149/80  (!) 156/80  Pulse: 85 73  85  Resp:  20    Temp:  98.2 F (36.8 C)  98.4 F (36.9 C)  TempSrc:  Oral  Oral  SpO2:  96% 94% 96%  Weight:      Height:        Intake/Output Summary (Last 24 hours) at 10/11/15 1613 Last data filed at 10/11/15 1337  Gross per 24 hour  Intake             1183 ml  Output             2050 ml  Net             -867 ml   Filed Weights   10/09/15 2227 10/10/15 0338 10/11/15 0500  Weight: (!) 173.5 kg (382 lb 9.6 oz) (!) 173.4 kg (382 lb 3.2 oz) (!) 172.8 kg (381 lb)    Examination:  General exam: Obese female who appears calm and comfortable  Respiratory system: b/l intermittent expiratory wheeze+,  Respiratory effort normal. Cardiovascular system: S1 & S2 heard, RRR.  No pedal edema. Gastrointestinal system: Abdomen is soft and nontender. Normal bowel sounds heard. Central nervous system: Alert and oriented. No focal neurological deficits. Extremities: Symmetric 5 x 5 power. Skin: No rashes, lesions or ulcers Psychiatry: Judgement and insight appear normal. Mood & affect appropriate.     Data Reviewed: I have personally reviewed following labs and imaging studies  CBC:  Recent Labs Lab 10/09/15 1720 10/10/15 0646  WBC 8.5  --   HGB 11.7*  --   HCT 37.2 36.0  MCV 74.0*  --   PLT 369  --    Basic Metabolic Panel:  Recent Labs Lab 10/09/15 1720 10/11/15 0840  NA 134* 133*  K 3.6 4.4  CL 101 99*  CO2 27 26  GLUCOSE 228* 288*  BUN 11 11  CREATININE 0.67 0.76  CALCIUM 8.6* 9.1   GFR: Estimated Creatinine Clearance: 141.8 mL/min (by C-G formula based on SCr of 0.76 mg/dL). Liver Function Tests:  Recent Labs Lab 10/10/15 0646 10/11/15 0840  AST 15 20  ALT 20 22  ALKPHOS 74 68  BILITOT 0.5 0.5  PROT 8.5* 8.2*  ALBUMIN 3.3* 3.2*   No results for input(s): LIPASE, AMYLASE in the last 168 hours. No results for input(s): AMMONIA in the last 168 hours. Coagulation  Profile: No results for input(s): INR, PROTIME in the last 168 hours. Cardiac Enzymes:  Recent Labs Lab 10/09/15 1720  TROPONINI <0.03   BNP (last 3 results) No results for input(s): PROBNP in the last 8760 hours. HbA1C:  Recent Labs  10/10/15 0646  HGBA1C 7.9*   CBG:  Recent Labs Lab 10/10/15 1138 10/10/15 1640 10/10/15 2101 10/11/15 0549 10/11/15 1140  GLUCAP 287* 189* 365* 227* 278*   Lipid Profile: No results for input(s): CHOL, HDL, LDLCALC, TRIG, CHOLHDL, LDLDIRECT in the last 72 hours. Thyroid Function Tests: No results for input(s): TSH, T4TOTAL, FREET4, T3FREE, THYROIDAB in the last 72 hours. Anemia Panel:  Recent Labs  10/10/15 0646  VITAMINB12 774  FERRITIN 12  TIBC 427  IRON 27*   Sepsis Labs: No results for input(s): PROCALCITON, LATICACIDVEN in the last 168 hours.  Recent Results (from the past 240 hour(s))  Rapid strep screen     Status: None   Collection Time: 10/09/15  5:30 PM  Result Value Ref Range Status   Streptococcus, Group A Screen (Direct) NEGATIVE NEGATIVE Final    Comment: (NOTE) A Rapid Antigen test may result negative if the antigen level in the sample is below the detection level of this test. The FDA has not cleared this test as a stand-alone test therefore the rapid antigen negative result has reflexed to a Group A Strep culture.   Culture, group A strep     Status: None   Collection Time: 10/09/15  5:30 PM  Result Value Ref Range Status   Specimen Description THROAT  Final   Special Requests NONE Reflexed from [id number]  Final   Culture MODERATE STREPTOCOCCUS,BETA HEMOLYTIC NOT GROUP A  Final   Report Status 10/11/2015 FINAL  Final         Radiology Studies: Dg Chest 2 View  Result Date: 10/09/2015 CLINICAL DATA:  Cough. History of diabetes, hypertension and sarcoidosis, CHF. EXAM: CHEST  2 VIEW COMPARISON:  Chest radiograph August 13, 2015 FINDINGS: Cardiac silhouette is moderately enlarged, unchanged. Mediastinal  silhouette is nonsuspicious. Diffusely coarsened pulmonary interstitium, similar to prior radiograph of the out pleural effusion. No pneumothorax. Large body habitus. Osseous structures are nonsuspicious. IMPRESSION: Stable cardiomegaly and similar interstitial changes. Electronically Signed   By: Awilda Metro M.D.   On: 10/09/2015 17:55   US Abdomen Limited Ruq  Result Date: 10/10/2015 CLINICAL DATA:  Right-sided pain. EXAM: US ABDOMEN LIMITED - RIGHT UPPER QUADRANT COMPARISON:  None. FINDINGS: Gallbladder: A 1.8 cm stone is seen in the gallbladder with no Murphy's sign, pericholecystic fluid, or wall thickening. Common bile duct: Diameter: 4.7 mm Liver: No focal lesions seen in the liver. The sonographer stated that the liver appears large but it was not measured. The liver was normal in size in June of 2016. IMPRESSION: 1. Cholelithiasis with no  wall thickening, pericholecystic fluid, or Murphy's sign. 2. No focal lesion in the liver. Questioned enlargement of the liver with no measurement provided. The liver was normal in size in June of 2016. Electronically Signed   By: Gerome Sam III M.D   On: 10/10/2015 17:24        Scheduled Meds: . citalopram  40 mg Oral Daily  . enoxaparin (LOVENOX) injection  80 mg Subcutaneous Q24H  . feeding supplement (ENSURE ENLIVE)  237 mL Oral BID BM  . ferrous sulfate  325 mg Oral BID WC  . furosemide  40 mg Oral BID  . gabapentin  300 mg Oral TID  . hydrALAZINE  25 mg Oral BID  . insulin aspart  0-15 Units Subcutaneous TID WC  . insulin aspart  0-5 Units Subcutaneous QHS  . insulin glargine  20 Units Subcutaneous Daily  . methylPREDNISolone (SOLU-MEDROL) injection  60 mg Intravenous Q6H  . metoprolol succinate  50 mg Oral Daily  . montelukast  10 mg Oral QHS  . multivitamin with minerals  1 tablet Oral Daily  . pantoprazole  40 mg Oral Daily  . sodium chloride flush  3 mL Intravenous Q12H   Continuous Infusions:    LOS: 1 day    Time  spent: 25 minutes    Dron Jaynie Collins, MD [facility] Pager [phone]  If 7PM-7AM, please contact night-coverage [email or web address] Password [facility] 10/11/2015, 4:13 PM

## 2015-10-11 NOTE — Progress Notes (Signed)
Inpatient Diabetes Program Recommendations  AACE/ADA: New Consensus Statement on Inpatient Glycemic Control (2015)  Target Ranges:  Prepandial:   less than 140 mg/dL      Peak postprandial:   less than 180 mg/dL (1-2 hours)      Critically ill patients:  140 - 180 mg/dL   Lab Results  Component Value Date   GLUCAP 278 (H) 10/11/2015   HGBA1C 7.9 (H) 10/10/2015    Review of Glycemic Control Results for Shelby Figueroa, Shelby Figueroa (MRN [id number]) as of 10/11/2015 11:55  Ref. Range 10/10/2015 11:38 10/10/2015 16:40 10/10/2015 21:01 10/11/2015 05:49 10/11/2015 11:40  Glucose-Capillary Latest Ref Range: 65 - 99 mg/dL 413287 (H) 244189 (H) 010365 (H) 227 (H) 278 (H)    Inpatient Diabetes Program Recommendations:   While on steroids, please consider increase in Novolog correction scale to resistant 0-20 units tid in addition to 0-5 units hs.  Thank you, Billy FischerJudy E. Bedelia Pong, RN, MSN, CDE Inpatient Glycemic Control Team Team Pager [phone] (8am-5pm) 10/11/2015 11:56 AM

## 2015-10-12 DIAGNOSIS — I10 Essential (primary) hypertension: Secondary | ICD-10-CM

## 2015-10-12 DIAGNOSIS — G4733 Obstructive sleep apnea (adult) (pediatric): Secondary | ICD-10-CM

## 2015-10-12 LAB — GLUCOSE, CAPILLARY
GLUCOSE-CAPILLARY: 223 mg/dL — AB (ref 65–99)
GLUCOSE-CAPILLARY: 329 mg/dL — AB (ref 65–99)
Glucose-Capillary: 346 mg/dL — ABNORMAL HIGH (ref 65–99)

## 2015-10-12 MED ORDER — INSULIN ASPART 100 UNIT/ML FLEXPEN: PEN_INJECTOR | SUBCUTANEOUS | 0 refills | Status: AC

## 2015-10-12 MED ORDER — FREESTYLE SYSTEM KIT: 1.0000 | PACK | 0 refills | Status: AC | PRN

## 2015-10-12 MED ORDER — INSULIN PEN NEEDLE 32G X 4 MM MISC: 0 refills | Status: AC

## 2015-10-12 MED ORDER — MONTELUKAST SODIUM 10 MG PO TABS: 10.0000 mg | ORAL_TABLET | Freq: Every day | ORAL | 0 refills | Status: AC

## 2015-10-12 MED ORDER — METFORMIN HCL 500 MG PO TABS: 1000.0000 mg | ORAL_TABLET | Freq: Two times a day (BID) | ORAL | 0 refills | Status: AC

## 2015-10-12 MED ORDER — HYDRALAZINE HCL 50 MG PO TABS
50.0000 mg | ORAL_TABLET | Freq: Two times a day (BID) | ORAL | Status: DC
  Administered 2015-10-12: 50 mg via ORAL
  Filled 2015-10-12: qty 1

## 2015-10-12 MED ORDER — PREDNISONE 20 MG PO TABS: 40.0000 mg | ORAL_TABLET | Freq: Every day | ORAL | Status: DC

## 2015-10-12 MED ORDER — HYDRALAZINE HCL 25 MG PO TABS
50.0000 mg | ORAL_TABLET | Freq: Two times a day (BID) | ORAL | 0 refills | Status: AC
Start: 2015-10-12 — End: ?

## 2015-10-12 MED ORDER — FUROSEMIDE 40 MG PO TABS: 40.0000 mg | ORAL_TABLET | Freq: Two times a day (BID) | ORAL | 0 refills | Status: AC

## 2015-10-12 MED ORDER — INSULIN GLARGINE 100 UNITS/ML SOLOSTAR PEN: 20.0000 [IU] | PEN_INJECTOR | Freq: Every day | SUBCUTANEOUS | 0 refills | Status: AC

## 2015-10-12 MED ORDER — PREDNISONE 20 MG PO TABS: 40.0000 mg | ORAL_TABLET | Freq: Every day | ORAL | 0 refills | Status: AC

## 2015-10-12 NOTE — Discharge Summary (Signed)
Physician Discharge Summary  Shelby Figueroa FGB:[id number] DOB: 09/29/1970 DOA: 10/09/2015  PCP: Pcp Not In System  Admit date: 10/09/2015 Discharge date: 10/12/2015  Admitted From: Home Disposition: Home  Recommendations for Outpatient Follow-up:  1. Follow up with PCP in 1 weeks 2. Please obtain BMP/CBC in one week 3. Please monitor blood sugar level at home    Home Health:No Equipment/Devices:2 liters of oxygen nasal canula at night and during ambulation.  Discharge Condition:stable CODE STATUS:Full Diet recommendation: carb modified heart healthy  Brief/Interim Summary: 45 y.o.femalewith medical history significant for obesity with BMI 64, asthma, pulmonary sarcoidosis, chronic diastolic CHF, hypertension, and type 2 diabetes mellitus transferred from [facility] where she had presented with 4 days of progressive exertional dyspnea and cough. She was admitted for mild URI causing asthma exacerbation.   Please see problems and hospital course below.   # Exertional  Shortness of breath associated with cough and wheeze: Possible asthma exacerbation. Pt has history of sarcoidosis. Chest x-ray unremarkable. Patient is responding with IV Solu-Medrol. Switch to oral prednisone for 5 days on discharge.Patient denied shortness of breath or chest pain today. Her lungs are clear on physical exam. Patient however requires 1-2 L of oxygen especially with ambulation. Patient likely has OSA and possible sleep apnea.  The patient is being discharged with 1-2 L of oxygen via nasal cannula. Patient reported that she has primary care doctor at [facility] and she can follow up with them next week. Patient also reported that C is going to have sleepy study next week. -continue bronchodilators and breathing treatment.   # Chronic diastolic CHF (congestive heart failure) ([place]): LVEF 55 %. Appears compensated. On home dose of lasix.    #Diabetes mellitus type II, non insulin  dependent ([place]): Uncontrolled in the setting of steroid use. Patient has HbA1c level of 7.9. Patient was evaluated by diabetic educator. I discussed with the patient regarding use of insulin. Insulin teaching provided by the nurse. Patient is being discharged with Lantus and sliding scale insulin. Prescriptions provided for the patient. I educated patient about the importance of monitoring blood sugar level and follow-up with PCP. Recommended low-carb diet at home. Prescription for home dose of metformin provided to the patient. Patient was educated to follow up with her PCP if she has any sign or symptoms of hypoglycemia including lightheadedness, sweating, dizziness, shortness of breath etc. Patient verbalized understanding.  # Hypertension: BP sub-optimal control. The dose of hydralazine and Lasix adjusted during hospitalization. Continue metoprolol. Advised to monitor blood pressure at home..   # h/o Pulmonary Sarcoidosis [facility]): follows at [facility], advised to continue outpatient follow up.   # Mild obesity with  OSA (obstructive sleep apnea): CPAP  Patient is medically stable on discharge. She is being discharged with insulin and oxygen. I discussed the discharge planning with the case manager and nurses.  Discharge Diagnoses:  Principal Problem:   Shortness of breath Active Problems:   Chronic diastolic CHF (congestive heart failure) (HCC)   Diabetes mellitus type II, non insulin dependent (HCC)   Hypertension   Sarcoidosis (HCC)   Asthma   OSA (obstructive sleep apnea)   Microcytic anemia   Dyspnea    Discharge Instructions  Discharge Instructions    Call MD for:    Complete by:  As directed    Diet - low sodium heart healthy    Complete by:  As directed    Discharge instructions    Complete by:  As  directed    Please check blood sugar level and follow up with your PCP.   Increase activity slowly    Complete by:  As directed        Medication List    STOP  taking these medications   metoprolol-hydrochlorothiazide 50-25 MG tablet Commonly known as:  LOPRESSOR HCT   SYMBICORT 160-4.5 MCG/ACT inhaler Generic drug:  budesonide-formoterol     TAKE these medications   albuterol 108 (90 Base) MCG/ACT inhaler Commonly known as:  PROVENTIL HFA;VENTOLIN HFA Inhale 2 puffs into the lungs every 6 (six) hours as needed for wheezing or shortness of breath.   citalopram 40 MG tablet Commonly known as:  CELEXA Take 40 mg by mouth daily.   furosemide 40 MG tablet Commonly known as:  LASIX Take 1 tablet (40 mg total) by mouth 2 (two) times daily. What changed:  when to take this  additional instructions   gabapentin 300 MG capsule Commonly known as:  NEURONTIN Take 300 mg by mouth 3 (three) times daily.   glucose monitoring kit monitoring kit 1 each by Does not apply route as needed for other. Please provide lancet, strips for one month supply each. Please check blood sugar 2-3 times per day and follow up with your PCP.   hydrALAZINE 25 MG tablet Commonly known as:  APRESOLINE Take 2 tablets (50 mg total) by mouth 2 (two) times daily. What changed:  how much to take   ibuprofen 200 MG tablet Commonly known as:  ADVIL,MOTRIN Take 800 mg by mouth 2 (two) times daily as needed for moderate pain.   insulin aspart 100 UNIT/ML FlexPen Commonly known as:  NOVOLOG FLEXPEN 0-15 Units, Subcutaneous, 3 times daily with meals,  CBG < 70: implement hypoglycemia protocol and call MD CBG 70 - 120: 0 units CBG 121 - 150: 2 units CBG 151 - 200: 3 units CBG 201 - 250: 5 units CBG 251 - 300: 8 units CBG 301 - 350: 11 units CBG 351 - 400: 15 units CBG > 400: call MD   insulin glargine 100 unit/mL Sopn Commonly known as:  LANTUS Inject 0.2 mLs (20 Units total) into the skin at bedtime.   Insulin Pen Needle 32G X 4 MM Misc Provide 1 month supply   metFORMIN 500 MG tablet Commonly known as:  GLUCOPHAGE Take 2 tablets (1,000 mg total) by mouth 2 (two)  times daily with a meal. What changed:  how much to take   metoprolol succinate 50 MG 24 hr tablet Commonly known as:  TOPROL-XL Take 50 mg by mouth daily. Take with or immediately following a meal.   montelukast 10 MG tablet Commonly known as:  SINGULAIR Take 1 tablet (10 mg total) by mouth at bedtime.   Multiple Vitamins tablet Take 1 tablet by mouth daily.   oxyCODONE-acetaminophen 5-325 MG tablet Commonly known as:  PERCOCET Take 1 tablet by mouth every 4 (four) hours as needed for severe pain.   predniSONE 20 MG tablet Commonly known as:  DELTASONE Take 2 tablets (40 mg total) by mouth daily with breakfast. Start taking on:  10/13/2015      Follow-up [facility] On 10/25/2015.   Why:  At 11:00 am for follow up Sarcoidosis. ::: At [place], [place] , [address] Contact information: [address] [place] [address] [phone]          Allergies  Allergen Reactions  . Ace Inhibitors Swelling  .  Codeine Itching and Rash  . Peanuts [Peanut Oil] Itching     Procedures/Studies: Dg Chest 2 View  Result Date: 10/09/2015 CLINICAL DATA:  Cough. History of diabetes, hypertension and sarcoidosis, CHF. EXAM: CHEST  2 VIEW COMPARISON:  Chest radiograph August 13, 2015 FINDINGS: Cardiac silhouette is moderately enlarged, unchanged. Mediastinal silhouette is nonsuspicious. Diffusely coarsened pulmonary interstitium, similar to prior radiograph of the out pleural effusion. No pneumothorax. Large body habitus. Osseous structures are nonsuspicious. IMPRESSION: Stable cardiomegaly and similar interstitial changes. Electronically Signed   By: Elon Alas M.D.   On: 10/09/2015 17:55   US Abdomen Limited Ruq  Result Date: 10/10/2015 CLINICAL DATA:  Right-sided pain. EXAM: US ABDOMEN LIMITED - RIGHT UPPER QUADRANT COMPARISON:  None. FINDINGS: Gallbladder: A 1.8 cm stone is seen in the gallbladder with no Murphy's sign,  pericholecystic fluid, or wall thickening. Common bile duct: Diameter: 4.7 mm Liver: No focal lesions seen in the liver. The sonographer stated that the liver appears large but it was not measured. The liver was normal in size in June of 2016. IMPRESSION: 1. Cholelithiasis with no wall thickening, pericholecystic fluid, or Murphy's sign. 2. No focal lesion in the liver. Questioned enlargement of the liver with no measurement provided. The liver was normal in size in June of 2016. Electronically Signed   By: Dorise Bullion III M.D   On: 10/10/2015 17:24      Subjective:   Discharge Exam: Vitals:   10/12/15 0918 10/12/15 1211  BP: 135/73 (!) 167/96  Pulse: 84 69  Resp: 18 18  Temp: 98.1 F (36.7 C) 98.1 F (36.7 C)   Vitals:   10/11/15 1949 10/12/15 0451 10/12/15 0918 10/12/15 1211  BP: (!) 158/79 (!) 162/91 135/73 (!) 167/96  Pulse: 87 88 84 69  Resp:  20 18 18   Temp: 98.8 F (37.1 C) 98.4 F (36.9 C) 98.1 F (36.7 C) 98.1 F (36.7 C)  TempSrc: Oral Oral Oral Oral  SpO2: 97% 96% 93% 96%  Weight:  (!) 173.6 kg (382 lb 11.2 oz)    Height:        General: Pt is alert, awake, not in acute distress Cardiovascular: RRR, S1/S2 +, no rubs, no gallops Respiratory: CTA bilaterally, no wheezing, no rhonchi Abdominal: Soft, NT, ND, bowel sounds + Extremities: no edema, no cyanosis, no LE edema    The results of significant diagnostics from this hospitalization (including imaging, microbiology, ancillary and laboratory) are listed below for reference.     Microbiology: Recent Results (from the past 240 hour(s))  Rapid strep screen     Status: None   Collection Time: 10/09/15  5:30 PM  Result Value Ref Range Status   Streptococcus, Group A Screen (Direct) NEGATIVE NEGATIVE Final    Comment: (NOTE) A Rapid Antigen test may result negative if the antigen level in the sample is below the detection level of this test. The FDA has not cleared this test as a stand-alone test  therefore the rapid antigen negative result has reflexed to a Group A Strep culture.   Culture, group A strep     Status: None   Collection Time: 10/09/15  5:30 PM  Result Value Ref Range Status   Specimen Description THROAT  Final   Special Requests NONE Reflexed from [id number]  Final   Culture MODERATE STREPTOCOCCUS,BETA HEMOLYTIC NOT GROUP A  Final   Report Status 10/11/2015 FINAL  Final     Labs: BNP (last 3 results)  Recent Labs  10/09/15 1720  BNP 56.3   Basic Metabolic Panel:  Recent Labs Lab 10/09/15 1720 10/11/15 0840  NA 134* 133*  K 3.6 4.4  CL 101 99*  CO2 27 26  GLUCOSE 228* 288*  BUN 11 11  CREATININE 0.67 0.76  CALCIUM 8.6* 9.1   Liver Function Tests:  Recent Labs Lab 10/10/15 0646 10/11/15 0840  AST 15 20  ALT 20 22  ALKPHOS 74 68  BILITOT 0.5 0.5  PROT 8.5* 8.2*  ALBUMIN 3.3* 3.2*   No results for input(s): LIPASE, AMYLASE in the last 168 hours. No results for input(s): AMMONIA in the last 168 hours. CBC:  Recent Labs Lab 10/09/15 1720 10/10/15 0646  WBC 8.5  --   HGB 11.7*  --   HCT 37.2 36.0  MCV 74.0*  --   PLT 369  --    Cardiac Enzymes:  Recent Labs Lab 10/09/15 1720  TROPONINI <0.03   BNP: Invalid input(s): POCBNP CBG:  Recent Labs Lab 10/11/15 1140 10/11/15 1633 10/11/15 2134 10/12/15 0614 10/12/15 1211  GLUCAP 278* 294* 277* 223* 346*   D-Dimer No results for input(s): DDIMER in the last 72 hours. Hgb A1c  Recent Labs  10/10/15 0646  HGBA1C 7.9*   Lipid Profile No results for input(s): CHOL, HDL, LDLCALC, TRIG, CHOLHDL, LDLDIRECT in the last 72 hours. Thyroid function studies No results for input(s): TSH, T4TOTAL, T3FREE, THYROIDAB in the last 72 hours.  Invalid input(s): FREET3 Anemia work up  Recent Labs  10/10/15 0646  VITAMINB12 774  FERRITIN 12  TIBC 427  IRON 27*   Urinalysis    Component Value Date/Time   COLORURINE YELLOW 09/10/2014 1647   APPEARANCEUR CLOUDY (A) 09/10/2014  1647   LABSPEC 1.021 09/10/2014 1647   PHURINE 6.5 09/10/2014 1647   GLUCOSEU NEGATIVE 09/10/2014 1647   HGBUR NEGATIVE 09/10/2014 1647   BILIRUBINUR NEGATIVE 09/10/2014 1647   KETONESUR NEGATIVE 09/10/2014 1647   PROTEINUR NEGATIVE 09/10/2014 1647   UROBILINOGEN 2.0 (H) 09/10/2014 1647   NITRITE NEGATIVE 09/10/2014 1647   LEUKOCYTESUR NEGATIVE 09/10/2014 1647   Sepsis Labs Invalid input(s): PROCALCITONIN,  WBC,  LACTICIDVEN Microbiology Recent Results (from the past 240 hour(s))  Rapid strep screen     Status: None   Collection Time: 10/09/15  5:30 PM  Result Value Ref Range Status   Streptococcus, Group A Screen (Direct) NEGATIVE NEGATIVE Final    Comment: (NOTE) A Rapid Antigen test may result negative if the antigen level in the sample is below the detection level of this test. The FDA has not cleared this test as a stand-alone test therefore the rapid antigen negative result has reflexed to a Group A Strep culture.   Culture, group A strep     Status: None   Collection Time: 10/09/15  5:30 PM  Result Value Ref Range Status   Specimen Description THROAT  Final   Special Requests NONE Reflexed from [id number]  Final   Culture MODERATE STREPTOCOCCUS,BETA HEMOLYTIC NOT GROUP A  Final   Report Status 10/11/2015 FINAL  Final     Time coordinating discharge: Over 30 minutes  SIGNED:   Rosita Fire, MD  [facility] 10/12/2015, 2:16 PM Pager   If 7PM-7AM, please contact night-coverage [email or web address] Password [email or web address]

## 2015-10-12 NOTE — Progress Notes (Signed)
Results for Cathleen FearsBONNER, Myrna (MRN [id number]) as of 10/12/2015 11:30  Ref. Range 10/11/2015 05:49 10/11/2015 11:40 10/11/2015 16:33 10/11/2015 21:34 10/12/2015 06:14  Glucose-Capillary Latest Ref Range: 65 - 99 mg/dL 409227 (H) 811278 (H) 914294 (H) 277 (H) 223 (H)   Noted that blood sugars continue to be greater than 180 mg/dl.  On steroids. Recommend increasing Lantus to 30 units daily. (173.6 kg x 0.2 Units = 34.72 units) Will continue to monitor blood sugars while in the hospital.  Smith MinceKendra Tamani Durney RN BSN CDE

## 2015-10-12 NOTE — Progress Notes (Signed)
Patient given instructions on how to use insulin pen and used a teaching kit with demonstration.  Patient was able to verbalize how to use insulin pen and how much insulin to give along with a return demonstration with demonstration kit.  Patient given rest of discharge instructions and all questions answered.  Patient discharged via wheelchair with all belongings.

## 2015-10-12 NOTE — Progress Notes (Signed)
Pt ambulated the length of the hallway with 1 stand by assist; O2 saturation dropped to 84% on room air, and pt was short of breath upon returning to the room; RN notified

## 2015-10-12 NOTE — Care Management Note (Signed)
Case Management Note  Patient Details Name: Shelby Figueroa MRN: [id number] Date of Birth: 11-04-1970  Subjective/Objective:                    Action/Plan: Discharge  to home with home health services(RN). Home oxygen ordered and portable tank to be delivered @ bedside prior to d/c.  Expected Discharge Date:      10/12/2015            Expected Discharge Plan:  Home w Home Health Services (Resides with 3 son in mom's home who currently is in [facility].)  In-House Referral:     Discharge planning Services  CM Consult  Post Acute Care Choice:    Choice offered to:  Patient  DME Arranged:  Shelby [facility]/ Shelby Figueroa [phone] DME Agency:  [facility]  [facility]:  [name] [phone] [facility]  [facility]  Status of Service:  Completed, signed off  If discussed at Long Length of Stay Meetings, dates discussed:    Additional Comments:  Epifanio LeschesCole, Caressa Hudson, RN 10/12/2015, 3:37 PM

## 2015-10-12 NOTE — Progress Notes (Signed)
Benefits check :  NOVOLOG FLEXPEN 100 UNIT/ ML     COVER- YES  CO-PAY- $ 3.70   30 DAY SUPPLY  PRIOR APPROVAL- NO  PHARMACY : ANY RETAIL  MAIL-ORDER FOR 90 DAY SUPPLY $ 3.70   PATIENT ALSO HAVE MEDICAID. Gae GallopAngela Sumi Lye RN,BSN,CM

## 2015-10-12 NOTE — Progress Notes (Signed)
SATURATION QUALIFICATIONS: (This note is used to comply with regulatory documentation for home oxygen)  Patient Saturations on Room Air at Rest = 90%  Patient Saturations on Room Air while Ambulating =84%  Patient Saturations on 1 Liters of oxygen while Ambulating = 92%  Please briefly explain why patient needs home oxygen: 

## 2015-10-12 NOTE — Progress Notes (Signed)
Benefits check in process, insulin aspart 100 UNIT/ML FlexPen (NOVOLOG FLEXPEN). CM to f/u with results.  Gae Gallopngela Nalu [place] RN,BSN,CM  [phone]

## 2015-12-02 ENCOUNTER — Encounter (HOSPITAL_BASED_OUTPATIENT_CLINIC_OR_DEPARTMENT_OTHER): Payer: Self-pay | Admitting: *Deleted

## 2015-12-02 ENCOUNTER — Emergency Department (HOSPITAL_BASED_OUTPATIENT_CLINIC_OR_DEPARTMENT_OTHER)
Admission: EM | Admit: 2015-12-02 | Discharge: 2015-12-02 | Disposition: A | Discharge status: Home / Self Care | Payer: Medicare Other | Attending: Emergency Medicine | Admitting: Emergency Medicine

## 2015-12-02 DIAGNOSIS — I110 Hypertensive heart disease with heart failure: Secondary | ICD-10-CM | POA: Insufficient documentation

## 2015-12-02 DIAGNOSIS — I509 Heart failure, unspecified: Secondary | ICD-10-CM | POA: Insufficient documentation

## 2015-12-02 DIAGNOSIS — W19XXXA Unspecified fall, initial encounter: Secondary | ICD-10-CM | POA: Insufficient documentation

## 2015-12-02 DIAGNOSIS — Z794 Long term (current) use of insulin: Secondary | ICD-10-CM | POA: Diagnosis not present

## 2015-12-02 DIAGNOSIS — Y939 Activity, unspecified: Secondary | ICD-10-CM | POA: Diagnosis not present

## 2015-12-02 DIAGNOSIS — S838X2A Sprain of other specified parts of left knee, initial encounter: Secondary | ICD-10-CM | POA: Diagnosis not present

## 2015-12-02 DIAGNOSIS — Z91010 Allergy to peanuts: Secondary | ICD-10-CM | POA: Insufficient documentation

## 2015-12-02 DIAGNOSIS — E119 Type 2 diabetes mellitus without complications: Secondary | ICD-10-CM | POA: Insufficient documentation

## 2015-12-02 DIAGNOSIS — Y929 Unspecified place or not applicable: Secondary | ICD-10-CM | POA: Insufficient documentation

## 2015-12-02 DIAGNOSIS — J45909 Unspecified asthma, uncomplicated: Secondary | ICD-10-CM | POA: Diagnosis not present

## 2015-12-02 DIAGNOSIS — R040 Epistaxis: Secondary | ICD-10-CM | POA: Diagnosis not present

## 2015-12-02 DIAGNOSIS — Y999 Unspecified external cause status: Secondary | ICD-10-CM | POA: Insufficient documentation

## 2015-12-02 DIAGNOSIS — S8992XA Unspecified injury of left lower leg, initial encounter: Secondary | ICD-10-CM | POA: Diagnosis present

## 2015-12-02 MED ORDER — OXYMETAZOLINE HCL 0.05 % NA SOLN
2.0000 | Freq: Two times a day (BID) | NASAL | Status: DC | PRN
  Administered 2015-12-02: 2 via NASAL
  Filled 2015-12-02: qty 15

## 2015-12-02 NOTE — ED Triage Notes (Signed)
Pt with nose bleeds x 2 episodes in 24 hours. Bleed controlled at present. Denies Injury

## 2015-12-02 NOTE — ED Notes (Signed)
Pt sat was between 89-91% without oxygen. This EMT placed pt on O2@2L  and pt stated she felt much better. Sat jumped up to 96%.

## 2015-12-02 NOTE — ED Provider Notes (Signed)
[place] [facility] Provider Note: Georgena Spurling, MD, FACEP  CSN: [id number] MRN: [id number] ARRIVAL: 12/02/15 at [place]: [place]  Epistaxis   HISTORY OF PRESENT ILLNESS  Shelby Figueroa is a 45 y.o. female who has had 2 episodes of epistaxis since yesterday morning. She describes the bleeding is heavy and associated with clots forming in her nostril. The bleeding was from the right naris. Bleeding was controlled with pressure. She is not bleeding presently. She has also had a mild pain on the right side of her face for 2 days. She states she fell about a week ago and continues to have pain and numbness in her right patellar ligament.   Past Medical History:  Diagnosis Date  . Anxiety   . Arthritis    "qwhere" (10/09/2015)  . Asthma   . Bilateral chronic knee pain   . CHF (congestive heart failure) ([place])   . Family history of adverse reaction to anesthesia    "my mother had anesthesia for OHS; woke up 6 days later" (10/09/2015)  . GERD (gastroesophageal reflux disease)   . Headache    "weekly" (10/09/2015)  . Hypertension   . Morbid obesity ([place])   . Sarcoidosis of lung ([place])   . Type II diabetes mellitus ([place])     Past Surgical History:  Procedure Laterality Date  . ACHILLES TENDON REPAIR Bilateral 2007  . KNEE ARTHROSCOPY Left 04/2006  . [place]    History reviewed. No pertinent family history.  Social History  Substance Use Topics  . Smoking status: Never Smoker  . Smokeless tobacco: Never Used  . Alcohol use Yes     Comment: 10/09/2015 "might have a drink 1-2 times/year'"    Prior to Admission medications   Medication Sig Start Date End Date Taking? Authorizing Provider  albuterol (PROVENTIL HFA;VENTOLIN HFA) 108 (90 BASE) MCG/ACT inhaler Inhale 2 puffs into the lungs every 6 (six) hours as needed for wheezing or shortness of breath.    Historical Provider, MD  citalopram (CELEXA) 40 MG tablet Take 40 mg by mouth daily.     Historical Provider, MD  furosemide (LASIX) 40 MG tablet Take 1 tablet (40 mg total) by mouth 2 (two) times daily. 10/12/15   Dron Tanna Furry, MD  gabapentin (NEURONTIN) 300 MG capsule Take 300 mg by mouth 3 (three) times daily.    Historical Provider, MD  glucose monitoring kit (FREESTYLE) monitoring kit 1 each by Does not apply route as needed for other. Please provide lancet, strips for one month supply each. Please check blood sugar 2-3 times per day and follow up with your PCP. 10/12/15   Dron Tanna Furry, MD  hydrALAZINE (APRESOLINE) 25 MG tablet Take 2 tablets (50 mg total) by mouth 2 (two) times daily. 10/12/15   Dron Tanna Furry, MD  ibuprofen (ADVIL,MOTRIN) 200 MG tablet Take 800 mg by mouth 2 (two) times daily as needed for moderate pain.    Historical Provider, MD  insulin aspart (NOVOLOG FLEXPEN) 100 UNIT/ML FlexPen 0-15 Units, Subcutaneous, 3 times daily with meals,  CBG < 70: implement hypoglycemia protocol and call MD CBG 70 - 120: 0 units CBG 121 - 150: 2 units CBG 151 - 200: 3 units CBG 201 - 250: 5 units CBG 251 - 300: 8 units CBG 301 - 350: 11 units CBG 351 - 400: 15 units CBG > 400: call MD 10/12/15   Rosita Fire, MD  insulin glargine (LANTUS) 100 unit/mL SOPN  Inject 0.2 mLs (20 Units total) into the skin at bedtime. 10/12/15   Dron Tanna Furry, MD  Insulin Pen Needle 32G X 4 MM MISC Provide 1 month supply 10/12/15   Dron Tanna Furry, MD  metFORMIN (GLUCOPHAGE) 500 MG tablet Take 2 tablets (1,000 mg total) by mouth 2 (two) times daily with a meal. 10/12/15   Dron Tanna Furry, MD  metoprolol succinate (TOPROL-XL) 50 MG 24 hr tablet Take 50 mg by mouth daily. Take with or immediately following a meal.    Historical Provider, MD  montelukast (SINGULAIR) 10 MG tablet Take 1 tablet (10 mg total) by mouth at bedtime. 10/12/15   Dron Tanna Furry, MD  Multiple Vitamins tablet Take 1 tablet by mouth daily.    Historical Provider, MD    oxyCODONE-acetaminophen (PERCOCET) 5-325 MG per tablet Take 1 tablet by mouth every 4 (four) hours as needed for severe pain. 09/24/14   Prosper N Rumley, DO    Allergies Ace inhibitors; Codeine; and Peanuts [peanut oil]   REVIEW OF SYSTEMS  Negative except as noted here or in the History of Present Illness.   PHYSICAL EXAMINATION  Initial Vital Signs Blood pressure (!) 163/114, pulse 87, temperature 98.5 F (36.9 C), temperature source Oral, resp. rate 24, height 5' 5"  (1.651 m), weight (!) 373 lb (169.2 kg), SpO2 91 %.  Examination General: Well-developed, obese female in no acute distress; appearance consistent with age of record HENT: normocephalic; atraumatic; no bleeding or clots seen in nares Eyes: pupils equal, round and reactive to light; extraocular muscles intact Neck: supple Heart: regular rate and rhythm Lungs: clear to auscultation bilaterally Abdomen: soft; nontender Extremities: No deformity; tenderness of right patellar ligament Neurologic: Awake, alert and oriented; motor function intact in all extremities and symmetric; no facial droop Skin: Warm and dry Psychiatric: Normal mood and affect   RESULTS  Summary of this visit's results, reviewed by myself:   EKG Interpretation  Date/Time:    Ventricular Rate:    PR Interval:    QRS Duration:   QT Interval:    QTC Calculation:   R Axis:     Text Interpretation:        Laboratory Studies: No results found for this or any previous visit (from the past 24 hour(s)). Imaging Studies: No results found.  ED COURSE  Nursing notes and initial vitals signs, including pulse oximetry, reviewed.  Vitals:   12/02/15 0459  BP: (!) 163/114  Pulse: 87  Resp: 24  Temp: 98.5 F (36.9 C)  TempSrc: Oral  SpO2: 91%  Weight: (!) 373 lb (169.2 kg)  Height: 5' 5"  (1.651 m)   Patient given Afrin and instructed in use. Patient also given Ace wrap for right knee.  PROCEDURES    ED DIAGNOSES     ICD-9-CM  ICD-10-CM   1. Left-sided epistaxis 784.7 R04.0   2. Sprain of left patella, initial encounter 844.8 B01.5A6W        Shanon Rosser, MD 12/02/15 [phone]

## 2017-05-12 IMAGING — CT CT ANGIO CHEST
2 of 7 series · 18 of 36 positions shown · IV contrast (isovue)
Comparison: CT of the abdomen and pelvis July 13, 2014 and chest
x-rays since September 24, 2014

CLINICAL DATA: Shortness of breath.

EXAM:
CT ANGIOGRAPHY CHEST WITH CONTRAST
TECHNIQUE: Multidetector CT imaging of the chest was performed using the
standard protocol during bolus administration of intravenous
contrast. Multiplanar CT image reconstructions and MIPs were
obtained to evaluate the vascular anatomy.
CONTRAST:  100 mL of Isovue 370

[Series 9: pe coronal mpr · coronal · 0.59mm/px · 1 of 129 slices shown]
[im 65/129  mediastinal]
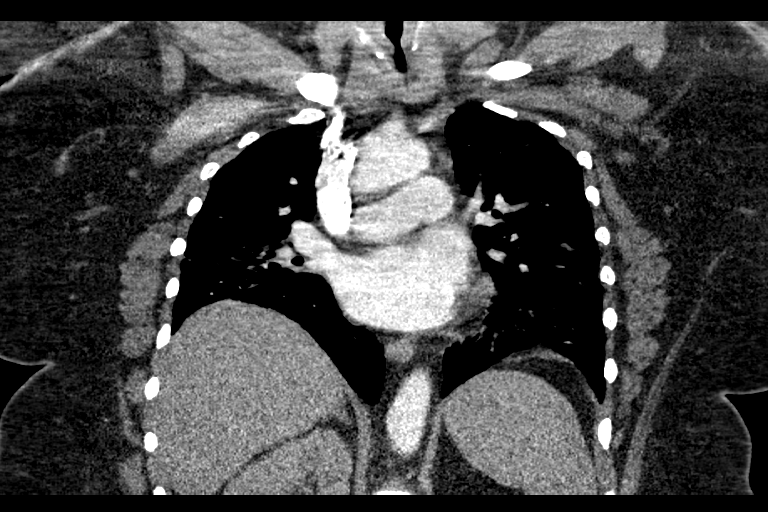

[Series 13: pe thins · axial · 0.70mm/px · z∈[-306,-57]mm · 17 of 277 slices shown]
[im 14/277  lung]
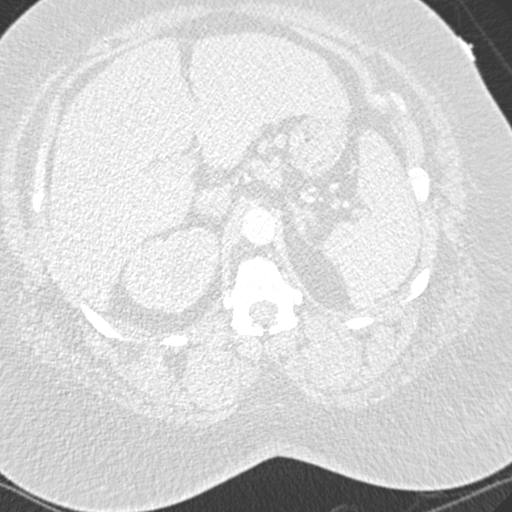
[im 28/277  mediastinal]
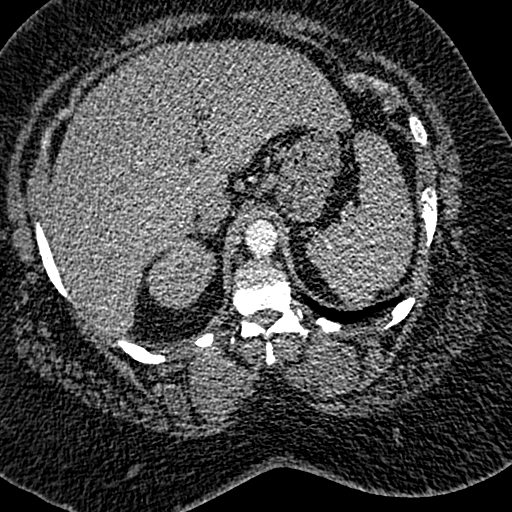
[im 42/277  lung]
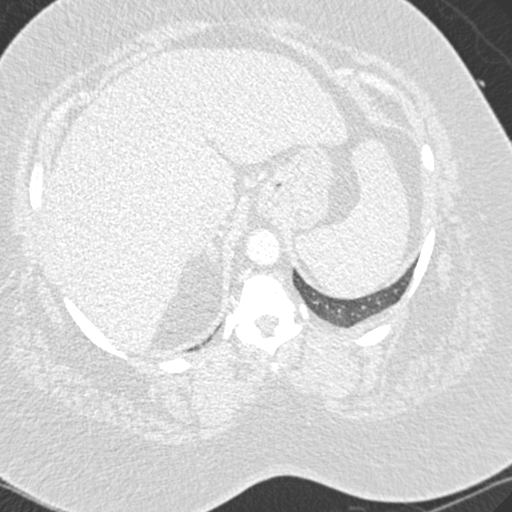
[im 56/277  mediastinal]
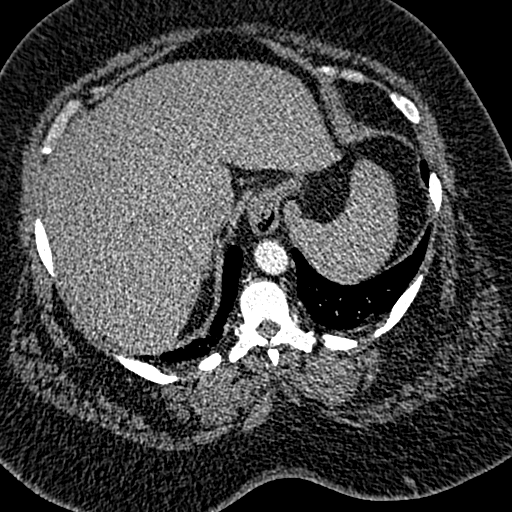
[im 83/277  lung]
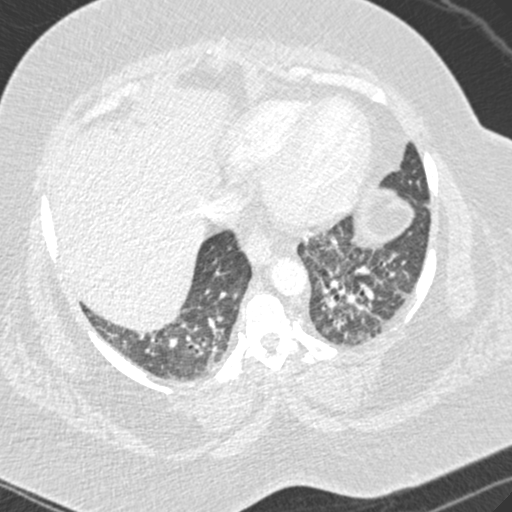
[im 97/277  mediastinal]
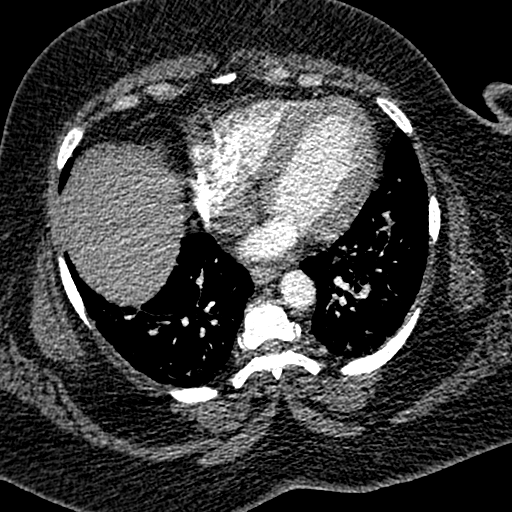
[im 111/277  lung]
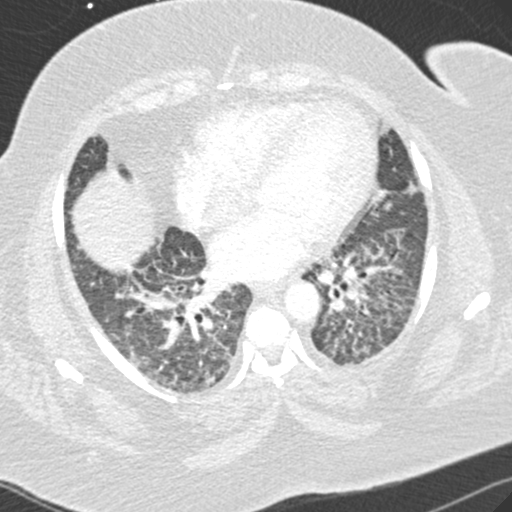
[im 125/277  mediastinal]
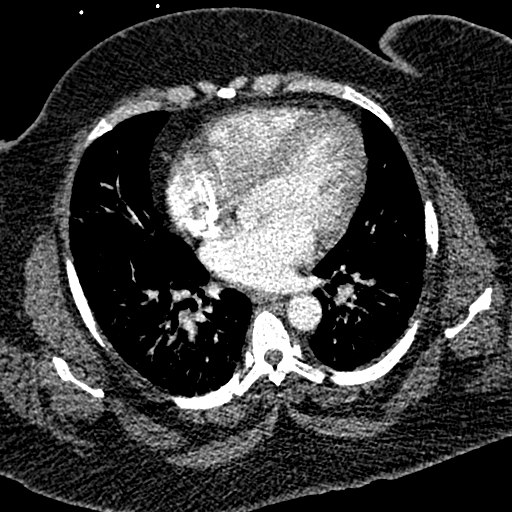
[im 139/277  lung]
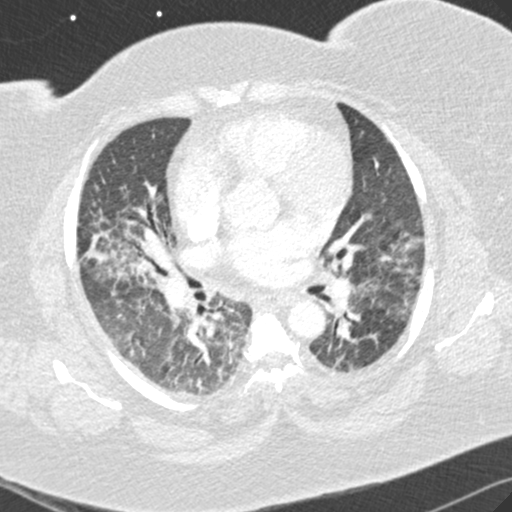
[im 152/277  mediastinal]
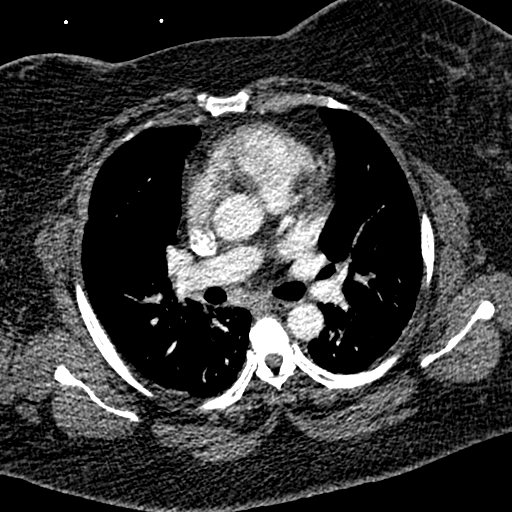
[im 166/277  lung]
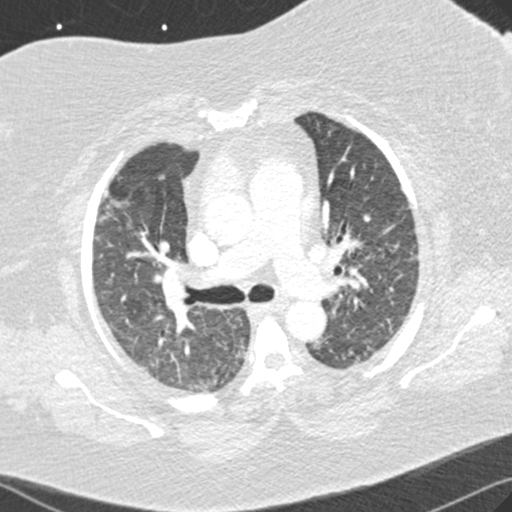
[im 180/277  mediastinal]
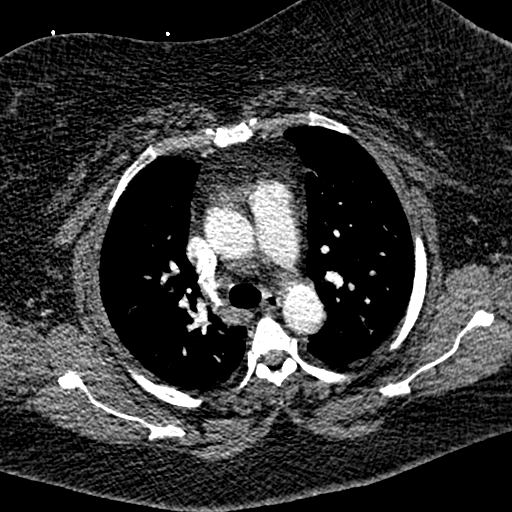
[im 194/277  lung]
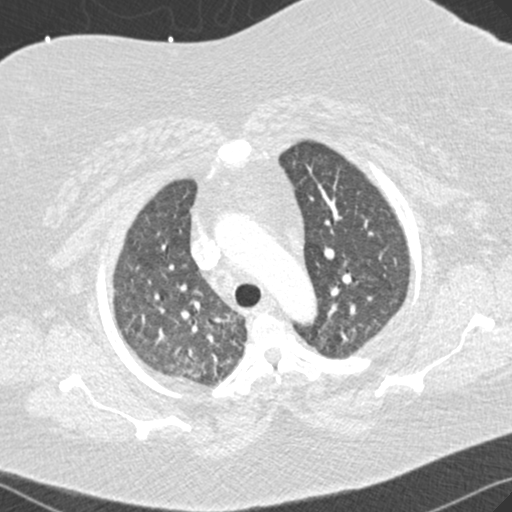
[im 221/277  mediastinal]
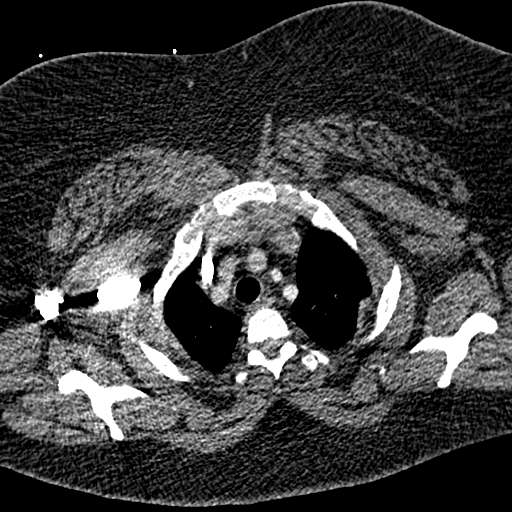
[im 235/277  lung]
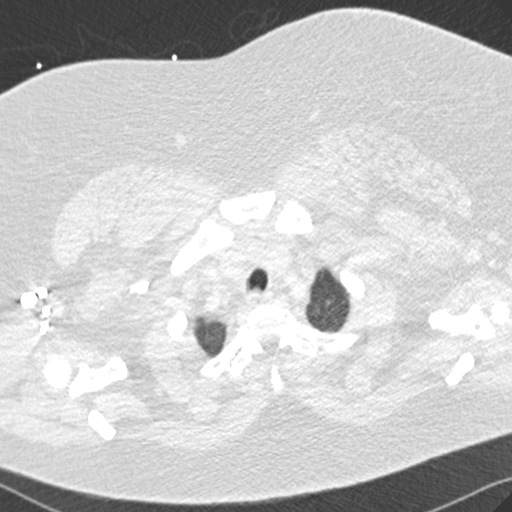
[im 249/277  mediastinal]
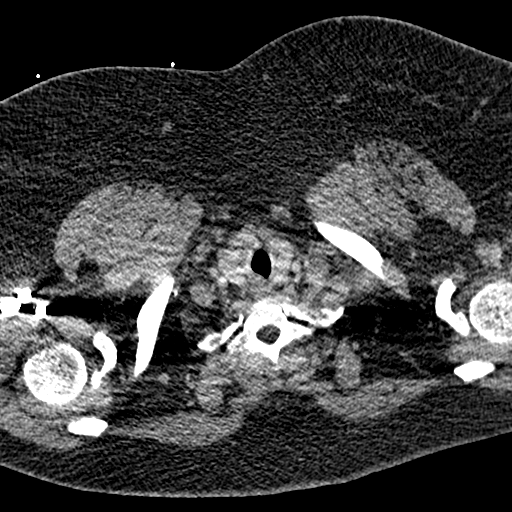
[im 263/277  lung]
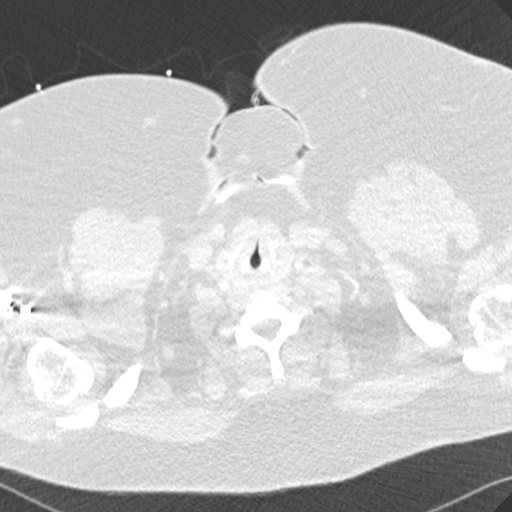

[18 of 36 positions shown; findings below may reference images not displayed]

FINDINGS: The central airways are normal. No pneumothorax. There are chronic
opacities in the lung bases bilaterally in addition to the right
middle lobe. There is bronchiectasis in the right middle lobe which
is also stable. These findings are explained by the patient's
history of sarcoidosis. No suspicious nodules or masses. There is a
prominent right paratracheal node measuring 16 mm on series 7, image
30. A few other shotty nodes are seen with no other evidence of
adenopathy. No effusions. Cardiomegaly is identified. There are
coronary artery calcifications. The thoracic aorta is normal in
caliber with no dissection. Evaluation for pulmonary emboli is
limited due to patient body habitus and respiratory motion. No
definitive pulmonary emboli.

Evaluation of upper abdomen demonstrates no acute abnormalities
prominent nodes in the gastrohepatic ligament are stable consistent
with known sarcoidosis.

Visualized bones are unremarkable.

Review of the MIP images confirms the above findings.
IMPRESSION: 1. Evaluation for pulmonary emboli is markedly limited due to
patient body habitus and respiratory motion. No large central emboli
identified. If there is continued concern, a repeat study could be
obtained when the patient is able.
2. The changes in the lung bases have not significantly changed
since Sunday June, 2014. This is likely due to the patient's known
sarcoidosis. The adenopathy in the chest and upper abdomen is also
likely due to sarcoidosis.

## 2018-08-04 IMAGING — US US ABDOMEN LIMITED
1 series · 14 of 25 positions shown · non-contrast
Comparison: None.

CLINICAL DATA: Right-sided pain.

EXAM:
US ABDOMEN LIMITED - RIGHT UPPER QUADRANT

[Series 1: us abdomen limited · 0.26mm/px · 14 of 51 slices shown]
[im 1/51]
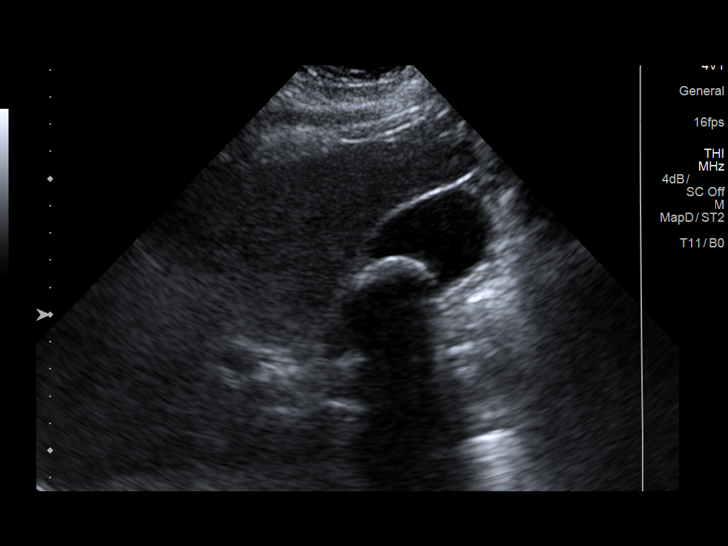
[im 5/51]
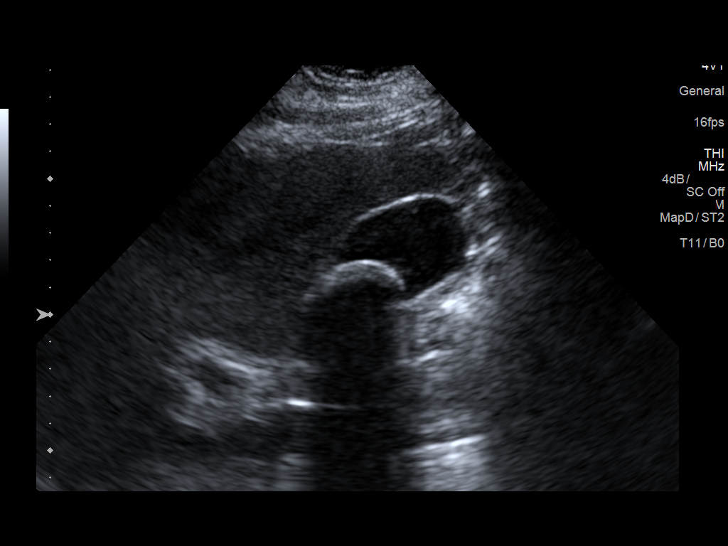
[im 9/51]
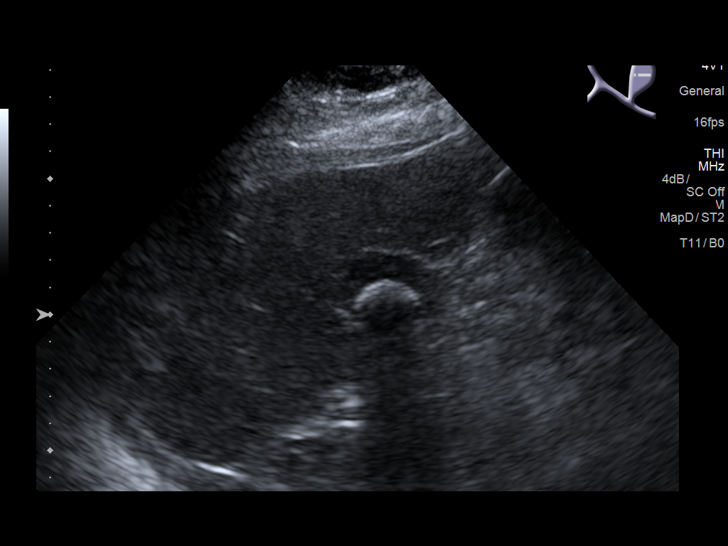
[im 13/51]
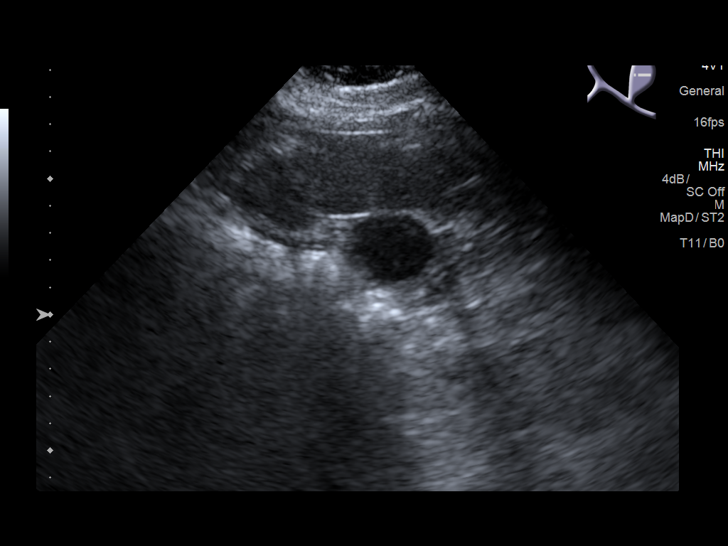
[im 17/51]
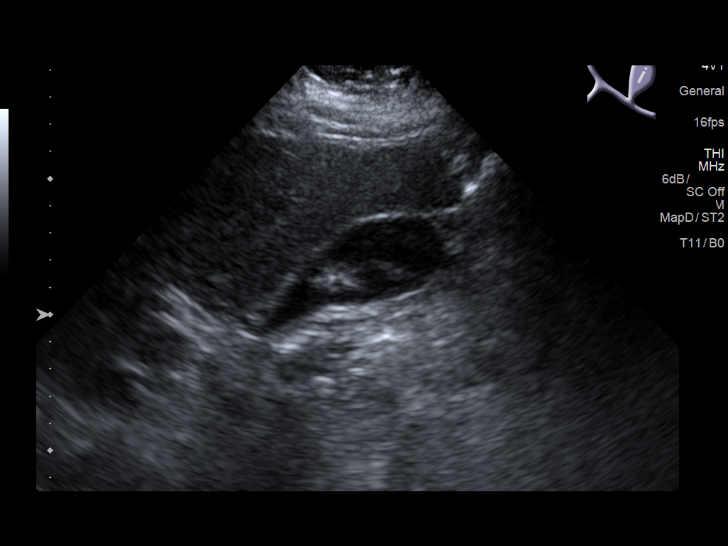
[im 19/51]
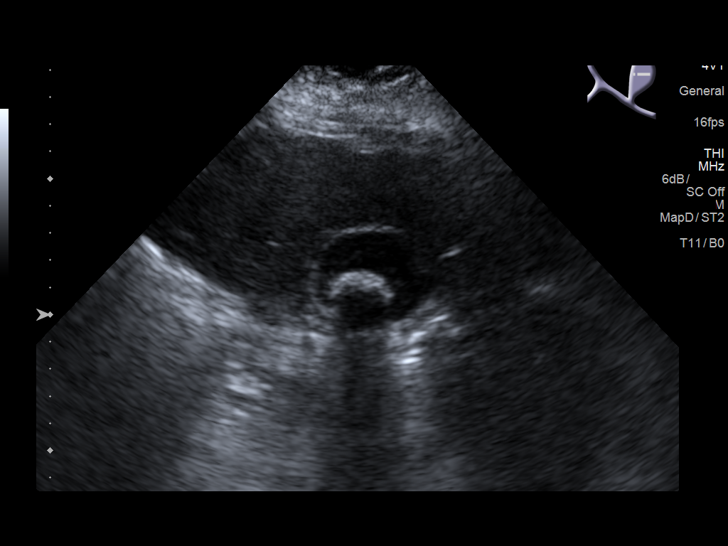
[im 23/51]
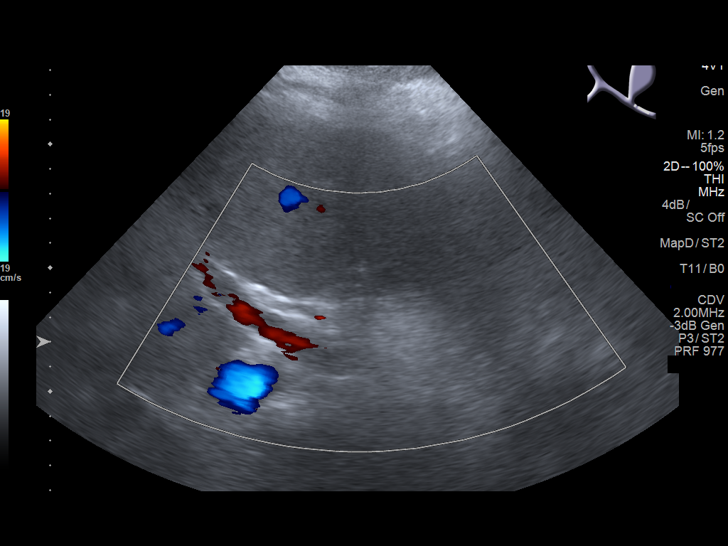
[im 28/51]
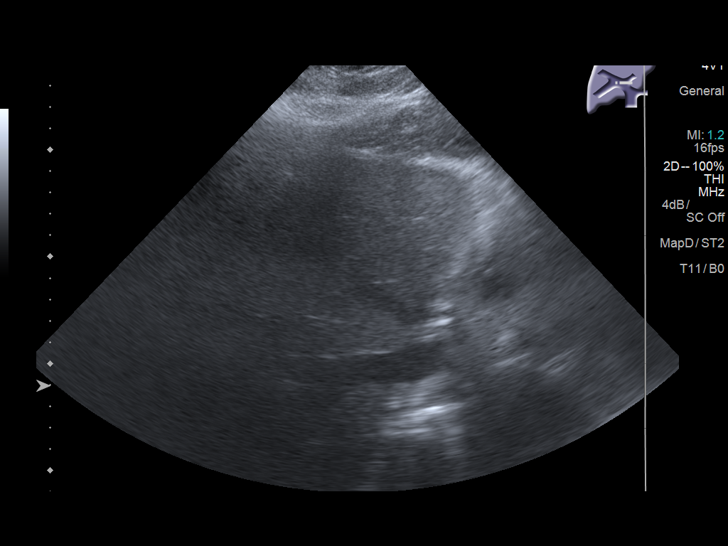
[im 32/51]
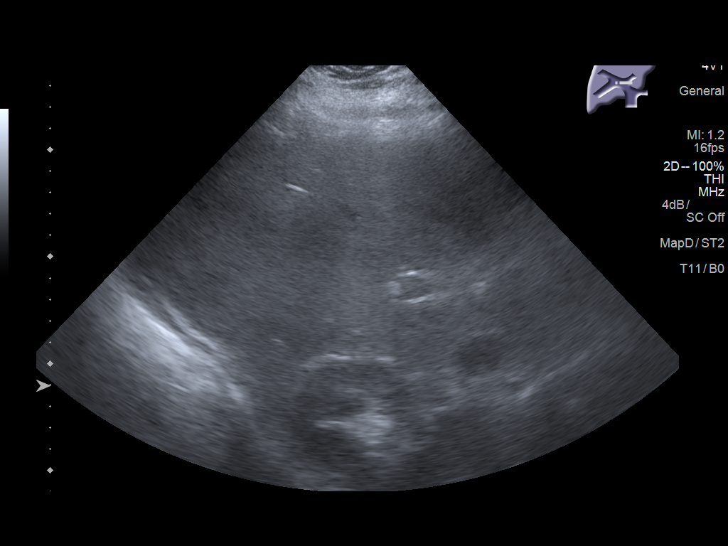
[im 34/51]
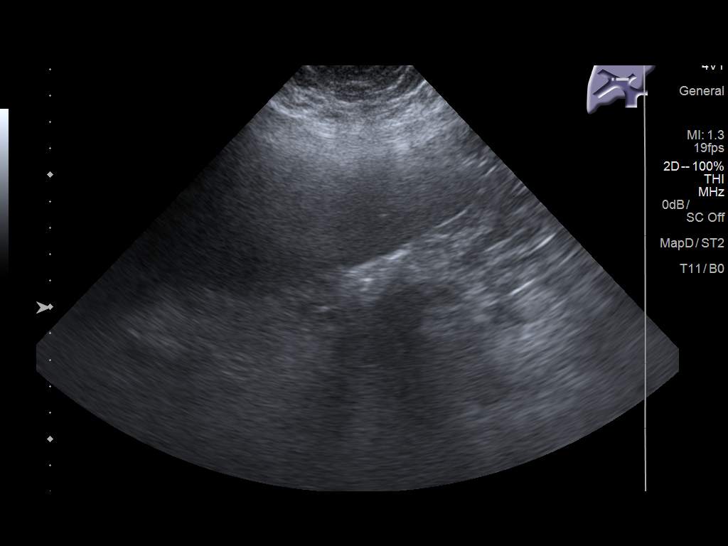
[im 38/51]
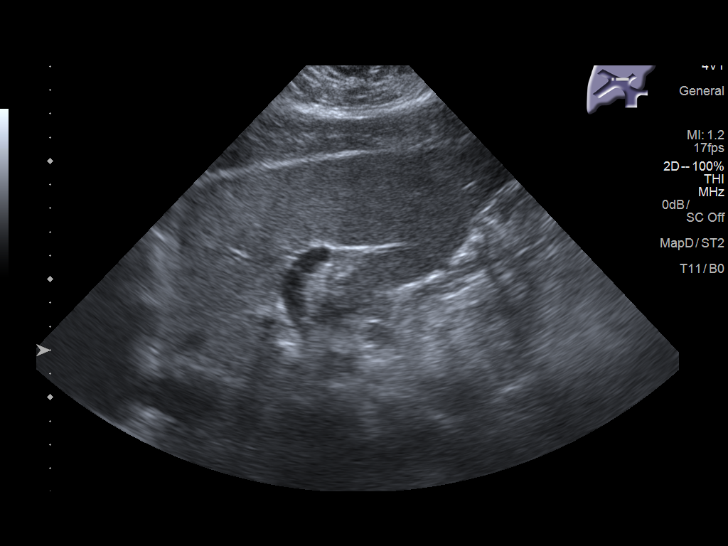
[im 42/51]
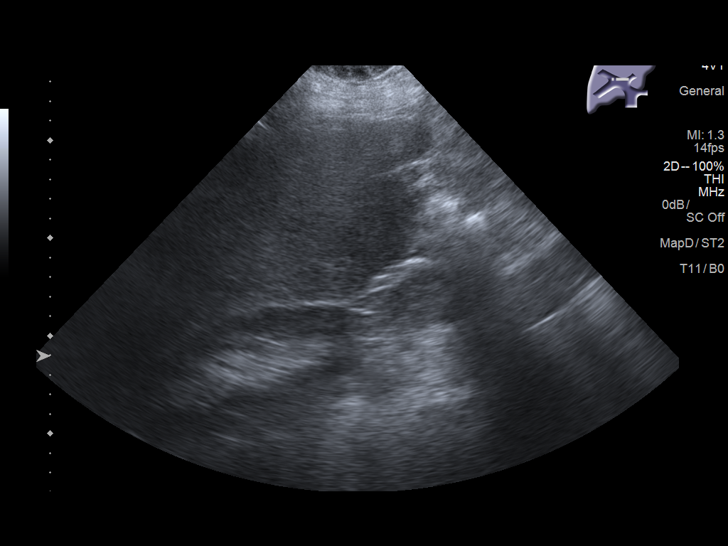
[im 46/51]
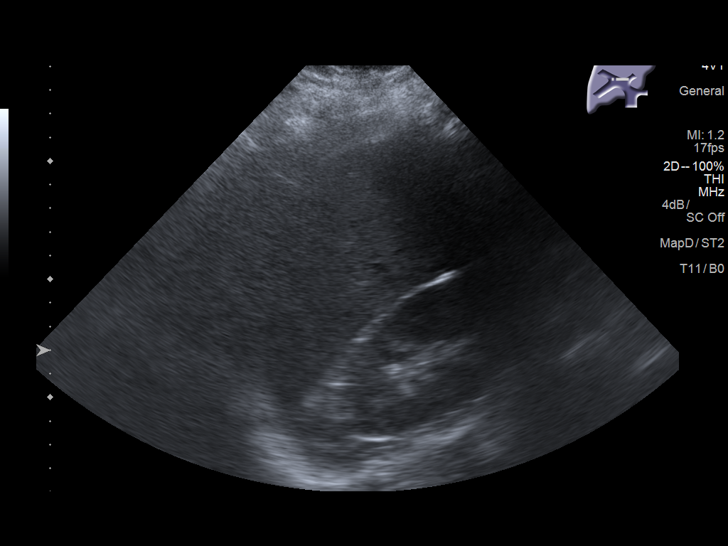
[im 51/51]
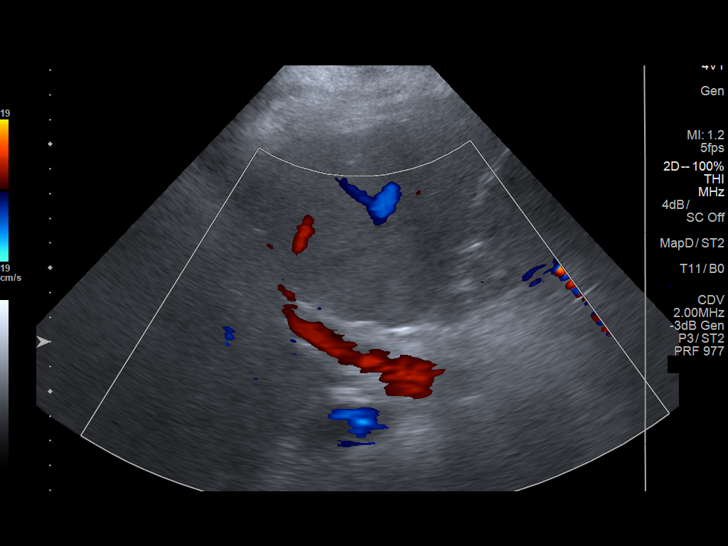

[14 of 25 positions shown; findings below may reference images not displayed]

FINDINGS: Gallbladder:

A 1.8 cm stone is seen in the gallbladder with no Murphy's sign,
pericholecystic fluid, or wall thickening.

Common bile duct:

Diameter: 4.7 mm

Liver:

No focal lesions seen in the liver. The sonographer stated that the
liver appears large but it was not measured. The liver was normal in
size in Sunday June, 2014.
IMPRESSION: 1. Cholelithiasis with no wall thickening, pericholecystic fluid, or
Murphy's sign.
2. No focal lesion in the liver. Questioned enlargement of the liver
with no measurement provided. The liver was normal in size in [DATE]

## 2023-03-21 ENCOUNTER — Encounter: Payer: Self-pay | Admitting: Interventional Radiology

## 2023-03-25 ENCOUNTER — Other Ambulatory Visit: Payer: Self-pay | Admitting: Interventional Radiology

## 2023-03-25 DIAGNOSIS — M1711 Unilateral primary osteoarthritis, right knee: Secondary | ICD-10-CM

## 2023-04-06 NOTE — Progress Notes (Shared)
 Chief Complaint: Patient was seen in consultation today for right knee pain  Referring Physician(s): Self-Referral   History of Present Illness: Shelby Figueroa is a 53 y.o. female with a medical history significant for HTN, DM2, pulmonary sarcoidosis, CHF, morbid obesity (gastric bypass 2022, now on Ozempic), anxiety, bilateral achilles rupture, and chronic bilateral knee pain. She had left knee surgery in 2008.   [place] Pain Score =  VAS Pain Score =   Past Medical History:  Diagnosis Date   Anxiety    Arthritis    "qwhere" (10/09/2015)   Asthma    Bilateral chronic knee pain    CHF (congestive heart failure) ([facility])    Family history of adverse reaction to anesthesia    "my mother had anesthesia for OHS; woke up 6 days later" (10/09/2015)   GERD (gastroesophageal reflux disease)    Headache    "weekly" (10/09/2015)   Hypertension    Morbid obesity ([facility])    Sarcoidosis of lung ([facility])    Type II diabetes mellitus ([facility])     Past Surgical History:  Procedure Laterality Date   ACHILLES TENDON REPAIR Bilateral 2007   KNEE ARTHROSCOPY Left 04/2006   TUBAL LIGATION  1995    Allergies: Ace inhibitors, Codeine, and Peanuts [peanut oil]  Medications: Prior to Admission medications   Medication Sig Start Date End Date Taking? Authorizing Provider  albuterol (PROVENTIL HFA;VENTOLIN HFA) 108 (90 BASE) MCG/ACT inhaler Inhale 2 puffs into the lungs every 6 (six) hours as needed for wheezing or shortness of breath.    [provider]  citalopram (CELEXA) 40 MG tablet Take 40 mg by mouth daily.    [provider]  furosemide (LASIX) 40 MG tablet Take 1 tablet (40 mg total) by mouth 2 (two) times daily. 10/12/15   Maxie Barb, MD  gabapentin (NEURONTIN) 300 MG capsule Take 300 mg by mouth 3 (three) times daily.    [provider]  glucose monitoring kit (FREESTYLE) monitoring kit 1 each by Does not apply route as needed for other. Please provide  lancet, strips for one month supply each. Please check blood sugar 2-3 times per day and follow up with your PCP. 10/12/15   Maxie Barb, MD  hydrALAZINE (APRESOLINE) 25 MG tablet Take 2 tablets (50 mg total) by mouth 2 (two) times daily. 10/12/15   Maxie Barb, MD  ibuprofen (ADVIL,MOTRIN) 200 MG tablet Take 800 mg by mouth 2 (two) times daily as needed for moderate pain.    [provider]  insulin aspart (NOVOLOG FLEXPEN) 100 UNIT/ML FlexPen 0-15 Units, Subcutaneous, 3 times daily with meals,  CBG < 70: implement hypoglycemia protocol and call MD CBG 70 - 120: 0 units CBG 121 - 150: 2 units CBG 151 - 200: 3 units CBG 201 - 250: 5 units CBG 251 - 300: 8 units CBG 301 - 350: 11 units CBG 351 - 400: 15 units CBG > 400: call MD 10/12/15   Maxie Barb, MD  insulin glargine (LANTUS) 100 unit/mL SOPN Inject 0.2 mLs (20 Units total) into the skin at bedtime. 10/12/15   Maxie Barb, MD  Insulin Pen Needle 32G X 4 MM MISC Provide 1 month supply 10/12/15   Maxie Barb, MD  metFORMIN (GLUCOPHAGE) 500 MG tablet Take 2 tablets (1,000 mg total) by mouth 2 (two) times daily with a meal. 10/12/15   Maxie Barb, MD  metoprolol succinate (TOPROL-XL) 50 MG 24 hr tablet Take 50 mg by  mouth daily. Take with or immediately following a meal.    [provider]  montelukast (SINGULAIR) 10 MG tablet Take 1 tablet (10 mg total) by mouth at bedtime. 10/12/15   Maxie Barb, MD  Multiple Vitamins tablet Take 1 tablet by mouth daily.    [provider]  oxyCODONE-acetaminophen (PERCOCET) 5-325 MG per tablet Take 1 tablet by mouth every 4 (four) hours as needed for severe pain. 09/24/14   Araceli Bouche, DO     No family history on file.  Social History   Socioeconomic History   Marital status: Divorced    Spouse name: Not on file   Number of children: Not on file   Years of education: Not on file   Highest education  level: Not on file  Occupational History   Not on file  Tobacco Use   Smoking status: Never   Smokeless tobacco: Never  Substance and Sexual Activity   Alcohol use: Yes    Comment: 10/09/2015 "might have a drink 1-2 times/year'"   Drug use: No   Sexual activity: Not Currently    Birth control/protection: None  Other Topics Concern   Not on file  Social History Narrative   Not on file   Social Drivers of Health   Financial Resource Strain: Low Risk  (08/13/2021)   Received from [facility], [facility] visits prior to 03/30/2022.   Overall Financial Resource Strain (CARDIA)    Difficulty of Paying Living Expenses: Not very hard  Food Insecurity: Low Risk  (10/09/2022)   Received from [facility]   Hunger Vital Sign    Worried About Running Out of Food in the Last Year: Never true    Ran Out of Food in the Last Year: Never true  Transportation Needs: No Transportation Needs (10/09/2022)   Received from [facility]    In the past 12 months, has lack of reliable transportation kept you from medical appointments, meetings, work or from getting things needed for daily living? : No  Physical Activity: Insufficiently Active (08/13/2021)   Received from [facility], [facility] visits prior to 03/30/2022.   Exercise Vital Sign    Days of Exercise per Week: 3 days    Minutes of Exercise per Session: 30 min  Stress: Stress Concern Present (08/13/2021)   Received from [facility], [facility] visits prior to 03/30/2022.   [employer] of Occupational Health - Occupational Stress Questionnaire    Feeling of Stress : Very much  Social Connections: Moderately Integrated (08/13/2021)   Received from [facility], [facility] visits prior to 03/30/2022.   Social Advertising account executive [NHANES]    Frequency of Communication with Friends and Family: More than three times a week     Frequency of Social Gatherings with Friends and Family: Once a week    Attends Religious Services: More than 4 times per year    Active Member of [employer] or Organizations: Yes    Attends Engineer, structural: More than 4 times per year    Marital Status: Divorced    Review of Systems: A 12 point ROS discussed and pertinent positives are indicated in the HPI above.  All other systems are negative.  Review of Systems  Vital Signs: There were no vitals taken for this visit.    Physical Exam  Imaging: No results found.  Labs:  CBC: No results for  input(s): "WBC", "HGB", "HCT", "PLT" in the last 8760 hours.  COAGS: No results for input(s): "INR", "APTT" in the last 8760 hours.  BMP: No results for input(s): "NA", "K", "CL", "CO2", "GLUCOSE", "BUN", "CALCIUM", "CREATININE", "GFRNONAA", "GFRAA" in the last 8760 hours.  Invalid input(s): "CMP"  LIVER FUNCTION TESTS: No results for input(s): "BILITOT", "AST", "ALT", "ALKPHOS", "PROT", "ALBUMIN" in the last 8760 hours.  TUMOR MARKERS: No results for input(s): "AFPTM", "CEA", "CA199", "CHROMGRNA" in the last 8760 hours.  Assessment and Plan:  53 year old female with a history of bilateral knee pain right > left.     Thank you for this interesting consult.  I greatly enjoyed meeting Shelby Figueroa and look forward to participating in their care.  A copy of this report was sent to the requesting provider on this date.  Electronically Signed: Mickie Kay, NP 04/06/2023, 11:51 AM   I spent a total of  40 Minutes   in face to face in clinical consultation, greater than 50% of which was counseling/coordinating care for right knee pain.

## 2023-04-07 ENCOUNTER — Inpatient Hospital Stay
Admission: RE | Admit: 2023-04-07 | Discharge: 2023-04-07 | Disposition: A | Discharge status: Home / Self Care | Payer: Medicare Other | Source: Ambulatory Visit | Attending: Interventional Radiology | Admitting: Interventional Radiology

## 2023-04-14 ENCOUNTER — Inpatient Hospital Stay
Admission: RE | Admit: 2023-04-14 | Discharge: 2023-04-14 | Disposition: A | Discharge status: Home / Self Care | Source: Ambulatory Visit | Attending: Interventional Radiology | Admitting: Interventional Radiology

## 2023-04-21 ENCOUNTER — Other Ambulatory Visit

## 2023-05-02 NOTE — Progress Notes (Signed)
 This encounter was conducted via the [employer] providing interactive audio and visual communication. The patient provided verbal consent to conduct a virtual appointment. The patient was located at their primary residence during this encounter.  Chief Complaint: Patient was seen in virtual video consultation today for right knee pain.   Referring Physician(s): Self-Referral   History of Present Illness: Shelby Figueroa is a 53 y.o. female with a medical history significant for HTN, DM2, pulmonary sarcoidosis, CHF, morbid obesity (gastric bypass 2022, now on Ozempic), anxiety, bilateral achilles rupture, and chronic bilateral knee pain. She had arthorsocpic left knee surgery in 2008.  She complains of bilateral knee pain, left greater than right. Her right knee pain started after she fell back in approximately 2007, onto her right knee. On her right side, she complaint of a constant pain at the front, center of the knee.  She also endorses numbness and burning sensation in her knee.  She complains of nerve pains that shoot down the back of her right leg.  She does not take any pain medication.  She has had prior knee injections, the last in 2018, which have never provided her any relief.   Womac Pain Score = 66/96 VAS Pain Score = 9/10  Past Medical History:  Diagnosis Date   Anxiety    Arthritis    "qwhere" (10/09/2015)   Asthma    Bilateral chronic knee pain    CHF (congestive heart failure) (HCC)    Family history of adverse reaction to anesthesia    "my mother had anesthesia for OHS; woke up 6 days later" (10/09/2015)   GERD (gastroesophageal reflux disease)    Headache    "weekly" (10/09/2015)   Hypertension    Morbid obesity (HCC)    Sarcoidosis of lung (HCC)    Type II diabetes mellitus (HCC)     Past Surgical History:  Procedure Laterality Date   ACHILLES TENDON REPAIR Bilateral 2007   KNEE ARTHROSCOPY Left 04/2006   TUBAL LIGATION  1995     Allergies: Ace inhibitors, Codeine, and Peanuts [peanut oil]  Medications: Prior to Admission medications   Medication Sig Start Date End Date Taking? Authorizing Provider  albuterol (PROVENTIL HFA;VENTOLIN HFA) 108 (90 BASE) MCG/ACT inhaler Inhale 2 puffs into the lungs every 6 (six) hours as needed for wheezing or shortness of breath.    [provider]  citalopram (CELEXA) 40 MG tablet Take 40 mg by mouth daily.    [provider]  furosemide (LASIX) 40 MG tablet Take 1 tablet (40 mg total) by mouth 2 (two) times daily. 10/12/15   Maxie Barb, MD  gabapentin (NEURONTIN) 300 MG capsule Take 300 mg by mouth 3 (three) times daily.    [provider]  glucose monitoring kit (FREESTYLE) monitoring kit 1 each by Does not apply route as needed for other. Please provide lancet, strips for one month supply each. Please check blood sugar 2-3 times per day and follow up with your PCP. 10/12/15   Maxie Barb, MD  hydrALAZINE (APRESOLINE) 25 MG tablet Take 2 tablets (50 mg total) by mouth 2 (two) times daily. 10/12/15   Maxie Barb, MD  ibuprofen (ADVIL,MOTRIN) 200 MG tablet Take 800 mg by mouth 2 (two) times daily as needed for moderate pain.    [provider]  insulin aspart (NOVOLOG FLEXPEN) 100 UNIT/ML FlexPen 0-15 Units, Subcutaneous, 3 times daily with meals,  CBG < 70: implement hypoglycemia protocol and call MD CBG 70 - 120:  0 units CBG 121 - 150: 2 units CBG 151 - 200: 3 units CBG 201 - 250: 5 units CBG 251 - 300: 8 units CBG 301 - 350: 11 units CBG 351 - 400: 15 units CBG > 400: call MD 10/12/15   Maxie Barb, MD  insulin glargine (LANTUS) 100 unit/mL SOPN Inject 0.2 mLs (20 Units total) into the skin at bedtime. 10/12/15   Maxie Barb, MD  Insulin Pen Needle 32G X 4 MM MISC Provide 1 month supply 10/12/15   Maxie Barb, MD  metFORMIN (GLUCOPHAGE) 500 MG tablet Take 2 tablets (1,000 mg total) by  mouth 2 (two) times daily with a meal. 10/12/15   Maxie Barb, MD  metoprolol succinate (TOPROL-XL) 50 MG 24 hr tablet Take 50 mg by mouth daily. Take with or immediately following a meal.    [provider]  montelukast (SINGULAIR) 10 MG tablet Take 1 tablet (10 mg total) by mouth at bedtime. 10/12/15   Maxie Barb, MD  Multiple Vitamins tablet Take 1 tablet by mouth daily.    [provider]  oxyCODONE-acetaminophen (PERCOCET) 5-325 MG per tablet Take 1 tablet by mouth every 4 (four) hours as needed for severe pain. 09/24/14   Araceli Bouche, DO     No family history on file.  Social History   Socioeconomic History   Marital status: Divorced    Spouse name: Not on file   Number of children: Not on file   Years of education: Not on file   Highest education level: Not on file  Occupational History   Not on file  Tobacco Use   Smoking status: Never   Smokeless tobacco: Never  Substance and Sexual Activity   Alcohol use: Yes    Comment: 10/09/2015 "might have a drink 1-2 times/year'"   Drug use: No   Sexual activity: Not Currently    Birth control/protection: None  Other Topics Concern   Not on file  Social History Narrative   Not on file   Social Drivers of Health   Financial Resource Strain: Low Risk  (08/13/2021)   [facility], [facility] visits prior to 03/30/2022.   Overall Financial Resource Strain (CARDIA)    Difficulty of Paying Living Expenses: Not very hard  Food Insecurity: Low Risk  (10/09/2022)   [facility]   Hunger Vital Sign    Worried About Running Out of Food in the Last Year: Never true    Ran Out of Food in the Last Year: Never true  Transportation Needs: No Transportation Needs (10/09/2022)   Received from [employer]    In the past 12 months, has lack of reliable transportation kept you from medical appointments, meetings, work or from getting  things needed for daily living? : No  Physical Activity: Insufficiently Active (08/13/2021)   Received from [facility], [facility] visits prior to 03/30/2022.   Exercise Vital Sign    Days of Exercise per Week: 3 days    Minutes of Exercise per Session: 30 min  Stress: Stress Concern Present (08/13/2021)   [facility], [facility] visits prior to 03/30/2022.   [employer] of Occupational Health - Occupational Stress Questionnaire    Feeling of Stress : Very much  Social Connections: Moderately Integrated (08/13/2021)   Received from [facility], [facility] visits prior to 03/30/2022.  Social Advertising account executive [NHANES]    Frequency of Communication with Friends and Family: More than three times a week    Frequency of Social Gatherings with Friends and Family: Once a week    Attends Religious Services: More than 4 times per year    Active Member of [employer] or Organizations: Yes    Attends Engineer, structural: More than 4 times per year    Marital Status: Divorced     Review of Systems: A 12 point ROS discussed and pertinent positives are indicated in the HPI above.  All other systems are negative.   Vital Signs: There were no vitals taken for this visit.   Physical Exam: Patient is alert, oriented and able to participate fully in the conversation. No apparent discomfort or distress observed. She appears appropriately dressed.   Imaging: R Knee 10/18/22   Kellgren and Lyman Bishop grade 2  Labs:  CBC: No results for input(s): "WBC", "HGB", "HCT", "PLT" in the last 8760 hours.  COAGS: No results for input(s): "INR", "APTT" in the last 8760 hours.  BMP: No results for input(s): "NA", "K", "CL", "CO2", "GLUCOSE", "BUN", "CALCIUM", "CREATININE", "GFRNONAA", "GFRAA" in the last 8760 hours.  Invalid input(s): "CMP"  LIVER FUNCTION TESTS: No results for input(s): "BILITOT",  "AST", "ALT", "ALKPHOS", "PROT", "ALBUMIN" in the last 8760 hours.  TUMOR MARKERS: No results for input(s): "AFPTM", "CEA", "CA199", "CHROMGRNA" in the last 8760 hours.  Assessment and Plan: 53 year old female with a history of bilateral, right greater than left knee pain [facility] 66/96).  She has had prior arthroscopic left knee surgery.  She does have radiographs that demonstrate moderate (K&G 2) right knee osteoarthritis.  Her history is complicated by knee pain that started after a fall.  I am concerned the pain may be related to meniscal or ligamentous injury.  Plan for MRI right knee WITH CONTRAST to assess for ligamentous/meniscal pain etiology and synovial hyperemia.  Follow up in IR clinic once MRI complete.     Marliss Coots, MD Pager: [phone]    I spent a total of  40 Minutes   in virtual video clinical consultation, greater than 50% of which was counseling/coordinating care for right knee pain.

## 2023-05-05 ENCOUNTER — Other Ambulatory Visit: Payer: Self-pay | Admitting: Interventional Radiology

## 2023-05-05 ENCOUNTER — Ambulatory Visit
Admission: RE | Admit: 2023-05-05 | Discharge: 2023-05-05 | Disposition: A | Discharge status: Home / Self Care | Source: Ambulatory Visit | Attending: Interventional Radiology | Admitting: Interventional Radiology

## 2023-05-05 DIAGNOSIS — M1711 Unilateral primary osteoarthritis, right knee: Secondary | ICD-10-CM

## 2023-05-05 HISTORY — PX: IR RADIOLOGIST EVAL & MGMT: IMG5224

## 2023-05-08 ENCOUNTER — Ambulatory Visit (HOSPITAL_COMMUNITY)
Admission: RE | Admit: 2023-05-08 | Discharge: 2023-05-08 | Disposition: A | Discharge status: Home / Self Care | Source: Ambulatory Visit | Attending: Interventional Radiology | Admitting: Interventional Radiology

## 2023-05-08 ENCOUNTER — Other Ambulatory Visit: Payer: Self-pay | Admitting: Interventional Radiology

## 2023-05-08 DIAGNOSIS — M1711 Unilateral primary osteoarthritis, right knee: Secondary | ICD-10-CM

## 2023-05-08 MED ORDER — GADOBUTROL 1 MMOL/ML IV SOLN: 10.0000 mL | Freq: Once | INTRAVENOUS | Status: DC | PRN

## 2023-05-09 ENCOUNTER — Encounter (INDEPENDENT_AMBULATORY_CARE_PROVIDER_SITE_OTHER): Payer: Self-pay

## 2023-05-19 ENCOUNTER — Ambulatory Visit
Admission: RE | Admit: 2023-05-19 | Discharge: 2023-05-19 | Disposition: A | Discharge status: Home / Self Care | Source: Ambulatory Visit | Attending: Interventional Radiology | Admitting: Interventional Radiology

## 2023-05-19 DIAGNOSIS — M1711 Unilateral primary osteoarthritis, right knee: Secondary | ICD-10-CM

## 2023-05-19 HISTORY — PX: IR RADIOLOGIST EVAL & MGMT: IMG5224

## 2023-05-19 NOTE — Progress Notes (Signed)
 This encounter was conducted via the [employer] providing interactive audio and visual communication. The patient provided verbal consent to conduct a virtual appointment. The patient was located at their primary residence during this encounter.   Chief Complaint: Patient was seen in virtual video consultation today for right knee pain.   Referring Physician(s): Self-referral   History of present illness: HPI from initial consultation 05/05/23 Shelby Figueroa is a 53 y.o. female with a medical history significant for HTN, DM2, pulmonary sarcoidosis, CHF, morbid obesity (gastric bypass 2022, now on Ozempic), anxiety, bilateral achilles rupture, and chronic bilateral knee pain. She had arthorsocpic left knee surgery in 2008.   She complains of bilateral knee pain, right greater than left. Her right knee pain started after she fell back in approximately 2007, onto her right knee. On her right side, she complains of a constant pain at the front, center of the knee. She also endorses numbness and burning sensation in her knee. She complains of nerve pains that shoot down the back of her right leg. She does not take any pain medication. She has had prior knee injections, the last in 2018, which have never provided her any relief.   Womac Pain Score = 66/96 VAS Pain Score = 9/10  Her history is complicated by right knee pain that started after she fell. I expressed concern that the pain may be related to a meniscal or ligamentous injury and I recommended an MRI for further work up. The MRI was performed 05/08/23.  Unfortunately the MRI team was unable to start an IV so the exam was performed without contrast.  She presents today via virtual video visit to discuss these results.   Past Medical History:  Diagnosis Date   Anxiety    Arthritis    "qwhere" (10/09/2015)   Asthma    Bilateral chronic knee pain    CHF (congestive heart failure) (HCC)    Family history of adverse reaction  to anesthesia    "my mother had anesthesia for OHS; woke up 6 days later" (10/09/2015)   GERD (gastroesophageal reflux disease)    Headache    "weekly" (10/09/2015)   Hypertension    Morbid obesity (HCC)    Sarcoidosis of lung (HCC)    Type II diabetes mellitus (HCC)     Past Surgical History:  Procedure Laterality Date   ACHILLES TENDON REPAIR Bilateral 2007   IR RADIOLOGIST EVAL & MGMT  05/05/2023   KNEE ARTHROSCOPY Left 04/2006   TUBAL LIGATION  1995    Allergies: Ace inhibitors, Codeine, and Peanuts [peanut oil]  Medications: Prior to Admission medications   Medication Sig Start Date End Date Taking? Authorizing Provider  albuterol  (PROVENTIL  HFA;VENTOLIN  HFA) 108 (90 BASE) MCG/ACT inhaler Inhale 2 puffs into the lungs every 6 (six) hours as needed for wheezing or shortness of breath.    [provider]  citalopram  (CELEXA ) 40 MG tablet Take 40 mg by mouth daily.    [provider]  furosemide  (LASIX ) 40 MG tablet Take 1 tablet (40 mg total) by mouth 2 (two) times daily. 10/12/15   Clementine Cutting, MD  gabapentin  (NEURONTIN ) 300 MG capsule Take 300 mg by mouth 3 (three) times daily.    [provider]  glucose monitoring kit (FREESTYLE) monitoring kit 1 each by Does not apply route as needed for other. Please provide lancet, strips for one month supply each. Please check blood sugar 2-3 times per day and follow up with your  PCP. 10/12/15   Clementine Cutting, MD  hydrALAZINE  (APRESOLINE ) 25 MG tablet Take 2 tablets (50 mg total) by mouth 2 (two) times daily. 10/12/15   Clementine Cutting, MD  ibuprofen  (ADVIL ,MOTRIN ) 200 MG tablet Take 800 mg by mouth 2 (two) times daily as needed for moderate pain.    [provider]  insulin  aspart (NOVOLOG  FLEXPEN) 100 UNIT/ML FlexPen 0-15 Units, Subcutaneous, 3 times daily with meals,  CBG < 70: implement hypoglycemia protocol and call MD CBG 70 - 120: 0 units CBG 121 - 150: 2 units CBG 151 - 200:  3 units CBG 201 - 250: 5 units CBG 251 - 300: 8 units CBG 301 - 350: 11 units CBG 351 - 400: 15 units CBG > 400: call MD 10/12/15   Clementine Cutting, MD  insulin  glargine (LANTUS ) 100 unit/mL SOPN Inject 0.2 mLs (20 Units total) into the skin at bedtime. 10/12/15   Clementine Cutting, MD  Insulin  Pen Needle 32G X 4 MM MISC Provide 1 month supply 10/12/15   Clementine Cutting, MD  metFORMIN  (GLUCOPHAGE ) 500 MG tablet Take 2 tablets (1,000 mg total) by mouth 2 (two) times daily with a meal. 10/12/15   Clementine Cutting, MD  metoprolol  succinate (TOPROL -XL) 50 MG 24 hr tablet Take 50 mg by mouth daily. Take with or immediately following a meal.    [provider]  montelukast  (SINGULAIR ) 10 MG tablet Take 1 tablet (10 mg total) by mouth at bedtime. 10/12/15   Clementine Cutting, MD  Multiple Vitamins tablet Take 1 tablet by mouth daily.    [provider]  oxyCODONE -acetaminophen  (PERCOCET) 5-325 MG per tablet Take 1 tablet by mouth every 4 (four) hours as needed for severe pain. 09/24/14   Martha Slack, DO     No family history on file.  Social History   Socioeconomic History   Marital status: Divorced    Spouse name: Not on file   Number of children: Not on file   Years of education: Not on file   Highest education level: Not on file  Occupational History   Not on file  Tobacco Use   Smoking status: Never   Smokeless tobacco: Never  Substance and Sexual Activity   Alcohol use: Yes    Comment: 10/09/2015 "might have a drink 1-2 times/year'"   Drug use: No   Sexual activity: Not Currently    Birth control/protection: None  Other Topics Concern   Not on file  Social History Narrative   Not on file   Social Drivers of Health   Financial Resource Strain: Low Risk  (08/13/2021)   [facility] from [facility], [facility] visits prior to 03/30/2022.   Overall Financial Resource Strain (CARDIA)    Difficulty of Paying Living  Expenses: Not very hard  Food Insecurity: Low Risk  (10/09/2022)   [facility]   Hunger Vital Sign    Worried About Running Out of Food in the Last Year: Never true    Ran Out of Food in the Last Year: Never true  Transportation Needs: No Transportation Needs (10/09/2022)   Received from [employer]    In the past 12 months, has lack of reliable transportation kept you from medical appointments, meetings, work or from getting things needed for daily living? : No  Physical Activity: Insufficiently Active (08/13/2021)   Received from [facility], [facility] visits prior to 03/30/2022.  Exercise Vital Sign    Days of Exercise per Week: 3 days    Minutes of Exercise per Session: 30 min  Stress: Stress Concern Present (08/13/2021)   Received from [facility], [facility] visits prior to 03/30/2022.   [employer] of Occupational Health - Occupational Stress Questionnaire    Feeling of Stress : Very much  Social Connections: Moderately Integrated (08/13/2021)   Received from [facility], [facility] visits prior to 03/30/2022.   Social Advertising account executive [NHANES]    Frequency of Communication with Friends and Family: More than three times a week    Frequency of Social Gatherings with Friends and Family: Once a week    Attends Religious Services: More than 4 times per year    Active Member of [employer] or Organizations: Yes    Attends Engineer, structural: More than 4 times per year    Marital Status: Divorced     Vital Signs: There were no vitals taken for this visit.  Physical Exam Patient is alert, oriented and able to participate fully in the conversation. No apparent discomfort or distress observed. She appears appropriately dressed.    Imaging: R Knee 10/18/22   Kellgren and Salena Craven grade 2  R Knee MRI 05/08/23  Severe medial compartment and patellofemoral  cartilage loss.  Labs:  CBC: No results for input(s): "WBC", "HGB", "HCT", "PLT" in the last 8760 hours.  COAGS: No results for input(s): "INR", "APTT" in the last 8760 hours.  BMP: No results for input(s): "NA", "K", "CL", "CO2", "GLUCOSE", "BUN", "CALCIUM", "CREATININE", "GFRNONAA", "GFRAA" in the last 8760 hours.  Invalid input(s): "CMP"  LIVER FUNCTION TESTS: No results for input(s): "BILITOT", "AST", "ALT", "ALKPHOS", "PROT", "ALBUMIN" in the last 8760 hours.  Assessment and Plan: 53 year old female with a history of bilateral, right greater than left knee pain [facility] 66/96).  She has had prior arthroscopic left knee surgery. Imaging demonstrates advanced right knee osteoarthritis.  I do not appreciate any major ligamentous injury.  She does have degenerative medial meniscal tear.   We discussed the rationale, periprocedural expectations including risks and benefits, and long term expectations related to geniculate artery embolization.  She is amenable and wishes to proceed.  Plan for right geniculate artery embolization via retrograde pedal approach at [facility] with moderate sedation.  Creasie Doctor, MD Pager: [phone]    I spent a total of 25 Minutes in virtual video clinical consultation, greater than 50% of which was counseling/coordinating care for right knee pain.

## 2023-05-22 ENCOUNTER — Other Ambulatory Visit: Payer: Self-pay | Admitting: Interventional Radiology

## 2023-05-22 DIAGNOSIS — M869 Osteomyelitis, unspecified: Secondary | ICD-10-CM

## 2023-05-28 NOTE — H&P (Signed)
 Chief Complaint: Patient was seen in consultation today for right knee pain; right geniculate artery embolization.  Referring Physician(s): Self-Referral  Supervising Physician: Creasie Doctor  Patient Status: DRI Shelby Figueroa - Out patient   History of Present Illness: Shelby Figueroa is a 53 y.o. female with a medical history significant for HTN, DM2, pulmonary sarcoidosis, CHF, morbid obesity (gastric bypass 2022, now on Ozempic), anxiety, bilateral achilles rupture, and chronic bilateral knee pain. She had arthroscopic left knee surgery in 2008.  She requested evaluation by our team for consideration of right geniculate artery embolization for her knee pain. She met with Dr. Jinx Mourning 05/05/23 and complained of bilateral knee pain, right greater than left. Her right knee pain started after she fell on her right knee around 2007. She reported a constant pain at the front, center of the knee. She also endorsed numbness and a burning sensation as well as nerve pain shooting down the back of her right leg. She was not taking any pain medication. She has had prior knee injections, the last in 2018, which have never provided her any relief.   Given that her right knee pain was triggered by a fall Dr. Jinx Mourning recommended a knee MRI for further work up. He wanted to ensure that osteoarthritis was the culprit for her knee pain as opposed to a meniscal or ligamentous injury. Imaging demonstrated advanced right knee osteoarthritis and a degenerative medial meniscal tear.   He discussed the rationale, periprocedural expectations including risks and benefits, and long term expectations related to geniculate artery embolization. She was amenable and wished to proceed.   Womac Pain Score = 66/96 VAS Pain Score = 9/10   Past Medical History:  Diagnosis Date   Anxiety    Arthritis    "qwhere" (10/09/2015)   Asthma    Bilateral chronic knee pain    CHF (congestive heart failure) (HCC)    Family history  of adverse reaction to anesthesia    "my mother had anesthesia for OHS; woke up 6 days later" (10/09/2015)   GERD (gastroesophageal reflux disease)    Headache    "weekly" (10/09/2015)   Hypertension    Morbid obesity (HCC)    Sarcoidosis of lung (HCC)    Type II diabetes mellitus (HCC)     Past Surgical History:  Procedure Laterality Date   ACHILLES TENDON REPAIR Bilateral 2007   IR RADIOLOGIST EVAL & MGMT  05/05/2023   IR RADIOLOGIST EVAL & MGMT  05/19/2023   KNEE ARTHROSCOPY Left 04/2006   TUBAL LIGATION  1995    Allergies: Ace inhibitors, Codeine, Peanuts [peanut oil], and Penicillins  Medications: Prior to Admission medications   Medication Sig Start Date End Date Taking? Authorizing Provider  albuterol  (PROVENTIL  HFA;VENTOLIN  HFA) 108 (90 BASE) MCG/ACT inhaler Inhale 2 puffs into the lungs every 6 (six) hours as needed for wheezing or shortness of breath.    [provider]  citalopram  (CELEXA ) 40 MG tablet Take 40 mg by mouth daily.    [provider]  furosemide  (LASIX ) 40 MG tablet Take 1 tablet (40 mg total) by mouth 2 (two) times daily. 10/12/15   Clementine Cutting, MD  gabapentin  (NEURONTIN ) 300 MG capsule Take 300 mg by mouth 3 (three) times daily.    [provider]  glucose monitoring kit (FREESTYLE) monitoring kit 1 each by Does not apply route as needed for other. Please provide lancet, strips for one month supply each. Please check blood sugar 2-3 times per day and follow  up with your PCP. 10/12/15   Clementine Cutting, MD  hydrALAZINE  (APRESOLINE ) 25 MG tablet Take 2 tablets (50 mg total) by mouth 2 (two) times daily. 10/12/15   Clementine Cutting, MD  ibuprofen  (ADVIL ,MOTRIN ) 200 MG tablet Take 800 mg by mouth 2 (two) times daily as needed for moderate pain.    [provider]  insulin  aspart (NOVOLOG  FLEXPEN) 100 UNIT/ML FlexPen 0-15 Units, Subcutaneous, 3 times daily with meals,  CBG < 70: implement hypoglycemia protocol  and call MD CBG 70 - 120: 0 units CBG 121 - 150: 2 units CBG 151 - 200: 3 units CBG 201 - 250: 5 units CBG 251 - 300: 8 units CBG 301 - 350: 11 units CBG 351 - 400: 15 units CBG > 400: call MD 10/12/15   Clementine Cutting, MD  insulin  glargine (LANTUS ) 100 unit/mL SOPN Inject 0.2 mLs (20 Units total) into the skin at bedtime. 10/12/15   Clementine Cutting, MD  Insulin  Pen Needle 32G X 4 MM MISC Provide 1 month supply 10/12/15   Clementine Cutting, MD  metFORMIN  (GLUCOPHAGE ) 500 MG tablet Take 2 tablets (1,000 mg total) by mouth 2 (two) times daily with a meal. 10/12/15   Clementine Cutting, MD  metoprolol  succinate (TOPROL -XL) 50 MG 24 hr tablet Take 50 mg by mouth daily. Take with or immediately following a meal.    [provider]  montelukast  (SINGULAIR ) 10 MG tablet Take 1 tablet (10 mg total) by mouth at bedtime. 10/12/15   Clementine Cutting, MD  Multiple Vitamins tablet Take 1 tablet by mouth daily.    [provider]  oxyCODONE -acetaminophen  (PERCOCET) 5-325 MG per tablet Take 1 tablet by mouth every 4 (four) hours as needed for severe pain. 09/24/14   Martha Slack, DO     No family history on file.  Social History   Socioeconomic History   Marital status: Divorced    Spouse name: Not on file   Number of children: Not on file   Years of education: Not on file   Highest education level: Not on file  Occupational History   Not on file  Tobacco Use   Smoking status: Never   Smokeless tobacco: Never  Substance and Sexual Activity   Alcohol use: Yes    Comment: 10/09/2015 "might have a drink 1-2 times/year'"   Drug use: No   Sexual activity: Not Currently    Birth control/protection: None  Other Topics Concern   Not on file  Social History Narrative   Not on file   Social Drivers of Health   Financial Resource Strain: Low Risk  (08/13/2021)   Received from [facility], [facility] visits prior to 03/30/2022.    Overall Financial Resource Strain (CARDIA)    Difficulty of Paying Living Expenses: Not very hard  Food Insecurity: Low Risk  (10/09/2022)   [facility] from [facility]   Hunger Vital Sign    Worried About Running Out of Food in the Last Year: Never true    Ran Out of Food in the Last Year: Never true  Transportation Needs: No Transportation Needs (10/09/2022)   Received from [employer]    In the past 12 months, has lack of reliable transportation kept you from medical appointments, meetings, work or from getting things needed for daily living? : No  Physical Activity: Insufficiently Active (08/13/2021)   Received from [employer], [facility] visits  prior to 03/30/2022.   Exercise Vital Sign    Days of Exercise per Week: 3 days    Minutes of Exercise per Session: 30 min  Stress: Stress Concern Present (08/13/2021)   Received from [facility], [facility] visits prior to 03/30/2022.   [employer] of Occupational Health - Occupational Stress Questionnaire    Feeling of Stress : Very much  Social Connections: Moderately Integrated (08/13/2021)   Received from [facility], [facility] visits prior to 03/30/2022.   Social Advertising account executive [NHANES]    Frequency of Communication with Friends and Family: More than three times a week    Frequency of Social Gatherings with Friends and Family: Once a week    Attends Religious Services: More than 4 times per year    Active Member of [employer] or Organizations: Yes    Attends Engineer, structural: More than 4 times per year    Marital Status: Divorced    Review of Systems: A 12 point ROS discussed and pertinent positives are indicated in the HPI above.  All other systems are negative.  Review of Systems  Musculoskeletal:  Positive for arthralgias, gait problem and myalgias.       Right knee pain.   All other systems reviewed and are  negative.   Vital Signs: BP 134/89 (BP Location: Left Arm)   Temp 98.4 F (36.9 C)   Resp 20   Physical Exam Constitutional:      General: She is not in acute distress.    Appearance: She is not ill-appearing.  HENT:     Mouth/Throat:     Mouth: Mucous membranes are moist.     Pharynx: Oropharynx is clear.  Cardiovascular:     Rate and Rhythm: Normal rate.     Pulses: Normal pulses.  Pulmonary:     Effort: Pulmonary effort is normal.  Abdominal:     Tenderness: There is no abdominal tenderness.  Musculoskeletal:        General: Tenderness present.     Comments: Right knee pain.   Skin:    General: Skin is warm and dry.  Neurological:     Mental Status: She is alert and oriented to person, place, and time.  Psychiatric:        Mood and Affect: Mood normal.        Behavior: Behavior normal.        Thought Content: Thought content normal.        Judgment: Judgment normal.     Imaging:  R Knee 10/18/22   Kellgren and Lawrence grade 2   R Knee MRI 05/08/23  Severe medial compartment and patellofemoral cartilage loss.  Labs:  CBC: No results for input(s): "WBC", "HGB", "HCT", "PLT" in the last 8760 hours.  COAGS: No results for input(s): "INR", "APTT" in the last 8760 hours.  BMP: No results for input(s): "NA", "K", "CL", "CO2", "GLUCOSE", "BUN", "CALCIUM", "CREATININE", "GFRNONAA", "GFRAA" in the last 8760 hours.  Invalid input(s): "CMP"  LIVER FUNCTION TESTS: No results for input(s): "BILITOT", "AST", "ALT", "ALKPHOS", "PROT", "ALBUMIN" in the last 8760 hours.  TUMOR MARKERS: No results for input(s): "AFPTM", "CEA", "CA199", "CHROMGRNA" in the last 8760 hours.  Assessment and Plan:  Right knee pain: Shelby Figueroa, 53 year old female, presents today for an image-guided right geniculate artery embolization.   Risks and benefits of this procedure were discussed with the patient including, but not limited to bleeding, infection, vascular injury or  contrast induced renal failure.  All of the patient's questions were answered, patient is agreeable to proceed. She has been NPO.   Consent signed and in chart.  Thank you for this interesting consult.  I greatly enjoyed meeting Bekka Popiel and look forward to participating in their care.  A copy of this report was sent to the requesting provider on this date.  Electronically Signed: Jetta Morrow, AGACNP-BC 05/30/2023, 10:00 AM   I spent a total of  30 Minutes   in face to face in clinical consultation, greater than 50% of which was counseling/coordinating care for right knee pain.

## 2023-05-29 ENCOUNTER — Telehealth: Payer: Self-pay

## 2023-05-29 MED ORDER — METHYLPREDNISOLONE 4 MG PO TBPK: ORAL_TABLET | ORAL | 0 refills | Status: AC

## 2023-05-29 NOTE — Discharge Instructions (Addendum)
 Discharge Instructions for Genicular Artery Embolization (GAE)   Post-Procedure Care   Activity:   Rest for the remainder of the day.   Avoid strenuous activities and heavy lifting for 48 hours.   Gradually resume normal activities as tolerated.   Pain Management:   You may experience mild pain or discomfort at the catheter insertion site or in the knee. This is normal.   Use over-the-counter pain relievers such as acetaminophen  (Tylenol ) or ibuprofen  (Advil ) as directed.   Apply an ice pack to the knee for 15-20 minutes every 2-3 hours to reduce swelling and discomfort.   Take Sol-Medrol  Pack as directed per pharmacy.   Wound Care:   Keep the catheter insertion site clean and dry.   Remove the bandage after 24 hours and replace it with a clean, dry bandage if needed.   Avoid soaking in baths, hot tubs, or swimming pools for 5 days. Showers are allowed.   Medications:   Take prescribed medications as directed.   If you were taking blood thinners, follow your physician's instructions on when to resume them.   Diet:   Resume your normal diet.   Drink plenty of fluids to stay hydrated.   Follow-Up:   Schedule a follow-up appointment with your physician as instructed.   Contact your physician if you experience increased pain, swelling, redness, or drainage at the insertion site, or if you have a fever over 100.13F (38C).   When to Seek Immediate Medical Attention   Call [phone] with any concerns:   Signs of infection at the catheter site (redness, warmth, pus).   Sudden weakness or numbness in the leg.   If you need to speak to someone after hours 5:00pm, please call the on call IR MD at [phone]. Tell them you are a patient of Dr. Jinx Mourning and you had a GAE today, along with any issues you are having.      Call 911 if:   Difficulty breathing or chest pain.   You have severe pain in your abdomen, and it does not get better with medicine.     You  have leg pain or leg swelling.   You feel dizzy, or you faint.   Do not wait to see if the symptoms will go away.   Do not drive yourself to the hospital.   Please ensure you follow these instructions carefully and reach out to your healthcare provider if you have any concerns or questions. Wishing you a smooth and speedy recovery!

## 2023-05-30 ENCOUNTER — Ambulatory Visit
Admission: RE | Admit: 2023-05-30 | Discharge: 2023-05-30 | Disposition: A | Discharge status: Home / Self Care | Source: Ambulatory Visit | Attending: Interventional Radiology | Admitting: Interventional Radiology

## 2023-05-30 DIAGNOSIS — M869 Osteomyelitis, unspecified: Secondary | ICD-10-CM

## 2023-05-30 HISTORY — PX: IR EMBO ARTERIAL NOT HEMORR HEMANG INC GUIDE ROADMAPPING: IMG5448

## 2023-05-30 MED ORDER — NITROGLYCERIN 1 MG/10 ML FOR IR/CATH LAB
100.0000 ug | INTRA_ARTERIAL | Status: DC | PRN
  Administered 2023-05-30: 100 ug via INTRA_ARTERIAL

## 2023-05-30 MED ORDER — KETOROLAC TROMETHAMINE 30 MG/ML IJ SOLN
30.0000 mg | Freq: Once | INTRAMUSCULAR | Status: AC
  Administered 2023-05-30: 30 mg via INTRAVENOUS

## 2023-05-30 MED ORDER — FENTANYL CITRATE PF 50 MCG/ML IJ SOSY
25.0000 ug | PREFILLED_SYRINGE | INTRAMUSCULAR | Status: DC | PRN
  Administered 2023-05-30 (×2): 50 ug via INTRAVENOUS

## 2023-05-30 MED ORDER — IODIXANOL 270 MG/ML IV SOLN
100.0000 mL | Freq: Once | INTRAVENOUS | Status: AC
  Administered 2023-05-30: 50 mL

## 2023-05-30 MED ORDER — ACETAMINOPHEN 10 MG/ML IV SOLN
1000.0000 mg | Freq: Once | INTRAVENOUS | Status: AC
  Administered 2023-05-30: 1000 mg via INTRAVENOUS

## 2023-05-30 MED ORDER — SODIUM CHLORIDE 0.9 % IV SOLN: Freq: Once | INTRAVENOUS | Status: AC

## 2023-05-30 MED ORDER — MIDAZOLAM HCL 2 MG/2ML IJ SOLN
1.0000 mg | INTRAMUSCULAR | Status: DC | PRN
  Administered 2023-05-30: 1 mg via INTRAVENOUS

## 2023-05-30 MED ORDER — LIDOCAINE-EPINEPHRINE 1 %-1:100000 IJ SOLN
10.0000 mL | Freq: Once | INTRAMUSCULAR | Status: AC
  Administered 2023-05-30: 10 mL via INTRADERMAL

## 2023-05-30 MED ORDER — DEXAMETHASONE SODIUM PHOSPHATE 10 MG/ML IJ SOLN
10.0000 mg | Freq: Once | INTRAMUSCULAR | Status: AC
  Administered 2023-05-30: 10 mg via INTRAVENOUS

## 2023-05-30 NOTE — Procedures (Signed)
 Interventional Radiology Procedure Note  Procedure: Right geniculate artery embolization   Findings: Please refer to procedural dictation for full description. Embolization of articular branch of right superior medial geniculate artery with 250 micron Embozene spheres.  Right posterior tibial artery access, 4 Fr, manual compression for hemostasis.  Complications: None immediate  Estimated Blood Loss: < 5 ml  Recommendations: IR will arrange 1 month outpatient follow up.   Creasie Doctor, MD

## 2023-06-02 ENCOUNTER — Other Ambulatory Visit: Payer: Self-pay | Admitting: Interventional Radiology

## 2023-06-02 DIAGNOSIS — M1711 Unilateral primary osteoarthritis, right knee: Secondary | ICD-10-CM

## 2023-06-24 NOTE — Progress Notes (Signed)
 This encounter was conducted via the [employer] providing interactive audio and visual communication.  The patient provided verbal consent to conduct a virtual appointment.  The patient was located at their primary residence during this encounter.  Referring Physician(s): Self-referral   Chief Complaint: The patient is seen in virtual video follow up today s/p right geniculate artery embolization 05/30/23  History of present illness: HPI from last clinic visit 05/19/23 Shelby Figueroa is a 53 y.o. female with a medical history significant for HTN, DM2, pulmonary sarcoidosis, CHF, morbid obesity (gastric bypass 2022, now on Ozempic), anxiety, bilateral achilles rupture, and chronic bilateral knee pain. She had arthorsocpic left knee surgery in 2008.   She complains of bilateral knee pain, right greater than left. Her right knee pain started after she fell back in approximately 2007, onto her right knee. On her right side, she complains of a constant pain at the front, center of the knee. She also endorses numbness and burning sensation in her knee. She complains of nerve pains that shoot down the back of her right leg. She does not take any pain medication. She has had prior knee injections, the last in 2018, which have never provided her any relief.   Womac Pain Score = 66/96 VAS Pain Score = 9/10   Her history is complicated by right knee pain that started after she fell. I expressed concern that the pain may be related to a meniscal or ligamentous injury and I recommended an MRI for further work up. The MRI was performed 05/08/23.  Unfortunately the MRI team was unable to start an IV so the exam was performed without contrast. Imaging showed advanced right knee osteoarthritis and a degenerative medial meniscal tear. We discussed the rationale, periprocedural expectations including risks and benefits, and long term expectations related to geniculate artery embolization. She was amenable  and wished to proceed.   On 05/30/23 she underwent a technically successful right geniculate artery embolization. She presents today via virtual video visit for follow up.   Past Medical History:  Diagnosis Date   Anxiety    Arthritis    "qwhere" (10/09/2015)   Asthma    Bilateral chronic knee pain    CHF (congestive heart failure) (HCC)    Family history of adverse reaction to anesthesia    "my mother had anesthesia for OHS; woke up 6 days later" (10/09/2015)   GERD (gastroesophageal reflux disease)    Headache    "weekly" (10/09/2015)   Hypertension    Morbid obesity (HCC)    Sarcoidosis of lung (HCC)    Type II diabetes mellitus (HCC)     Past Surgical History:  Procedure Laterality Date   ACHILLES TENDON REPAIR Bilateral 2007   IR EMBO ARTERIAL NOT HEMORR HEMANG INC GUIDE ROADMAPPING  05/30/2023   IR RADIOLOGIST EVAL & MGMT  05/05/2023   IR RADIOLOGIST EVAL & MGMT  05/19/2023   KNEE ARTHROSCOPY Left 04/2006   TUBAL LIGATION  1995    Allergies: Ace inhibitors, Codeine, Peanuts [peanut oil], and Penicillins  Medications: Prior to Admission medications   Medication Sig Start Date End Date Taking? Authorizing Provider  albuterol  (PROVENTIL  HFA;VENTOLIN  HFA) 108 (90 BASE) MCG/ACT inhaler Inhale 2 puffs into the lungs every 6 (six) hours as needed for wheezing or shortness of breath.    [provider]  citalopram  (CELEXA ) 40 MG tablet Take 40 mg by mouth daily.    [provider]  furosemide  (LASIX ) 40 MG tablet Take 1 tablet (40  mg total) by mouth 2 (two) times daily. 10/12/15   Clementine Cutting, MD  gabapentin  (NEURONTIN ) 300 MG capsule Take 300 mg by mouth 3 (three) times daily.    [provider]  glucose monitoring kit (FREESTYLE) monitoring kit 1 each by Does not apply route as needed for other. Please provide lancet, strips for one month supply each. Please check blood sugar 2-3 times per day and follow up with your PCP. 10/12/15   Clementine Cutting, MD  hydrALAZINE  (APRESOLINE ) 25 MG tablet Take 2 tablets (50 mg total) by mouth 2 (two) times daily. 10/12/15   Clementine Cutting, MD  ibuprofen  (ADVIL ,MOTRIN ) 200 MG tablet Take 800 mg by mouth 2 (two) times daily as needed for moderate pain.    [provider]  insulin  aspart (NOVOLOG  FLEXPEN) 100 UNIT/ML FlexPen 0-15 Units, Subcutaneous, 3 times daily with meals,  CBG < 70: implement hypoglycemia protocol and call MD CBG 70 - 120: 0 units CBG 121 - 150: 2 units CBG 151 - 200: 3 units CBG 201 - 250: 5 units CBG 251 - 300: 8 units CBG 301 - 350: 11 units CBG 351 - 400: 15 units CBG > 400: call MD 10/12/15   Clementine Cutting, MD  insulin  glargine (LANTUS ) 100 unit/mL SOPN Inject 0.2 mLs (20 Units total) into the skin at bedtime. 10/12/15   Clementine Cutting, MD  Insulin  Pen Needle 32G X 4 MM MISC Provide 1 month supply 10/12/15   Clementine Cutting, MD  metFORMIN  (GLUCOPHAGE ) 500 MG tablet Take 2 tablets (1,000 mg total) by mouth 2 (two) times daily with a meal. 10/12/15   Clementine Cutting, MD  methylPREDNISolone  (MEDROL  DOSEPAK) 4 MG TBPK tablet Taper Per Instructions 05/29/23   Adesuwa Osgood, Antonia Battiest, MD  metoprolol  succinate (TOPROL -XL) 50 MG 24 hr tablet Take 50 mg by mouth daily. Take with or immediately following a meal.    [provider]  montelukast  (SINGULAIR ) 10 MG tablet Take 1 tablet (10 mg total) by mouth at bedtime. 10/12/15   Clementine Cutting, MD  Multiple Vitamins tablet Take 1 tablet by mouth daily.    [provider]  oxyCODONE -acetaminophen  (PERCOCET) 5-325 MG per tablet Take 1 tablet by mouth every 4 (four) hours as needed for severe pain. 09/24/14   Martha Slack, DO     No family history on file.  Social History   Socioeconomic History   Marital status: Divorced    Spouse name: Not on file   Number of children: Not on file   Years of education: Not on file   Highest education level: Not on file  Occupational  History   Not on file  Tobacco Use   Smoking status: Never   Smokeless tobacco: Never  Substance and Sexual Activity   Alcohol use: Yes    Comment: 10/09/2015 "might have a drink 1-2 times/year'"   Drug use: No   Sexual activity: Not Currently    Birth control/protection: None  Other Topics Concern   Not on file  Social History Narrative   Not on file   Social Drivers of Health   Financial Resource Strain: Low Risk  (08/13/2021)   Received from [facility], [facility] visits prior to 03/30/2022.   Overall Financial Resource Strain (CARDIA)    Difficulty of Paying Living Expenses: Not very hard  Food Insecurity: Low Risk  (10/09/2022)   Received from [facility]   Hunger Vital Sign  Worried About Programme researcher, broadcasting/film/video in the Last Year: Never true    Ran Out of Food in the Last Year: Never true  Transportation Needs: No Transportation Needs (10/09/2022)   Received from [employer]    In the past 12 months, has lack of reliable transportation kept you from medical appointments, meetings, work or from getting things needed for daily living? : No  Physical Activity: Insufficiently Active (08/13/2021)   [facility] from [facility], [facility] visits prior to 03/30/2022.   Exercise Vital Sign    Days of Exercise per Week: 3 days    Minutes of Exercise per Session: 30 min  Stress: Stress Concern Present (08/13/2021)   Received from [facility], [facility] visits prior to 03/30/2022.   [employer] of Occupational Health - Occupational Stress Questionnaire    Feeling of Stress : Very much  Social Connections: Moderately Integrated (08/13/2021)   Received from [facility], [facility] visits prior to 03/30/2022.   Social Advertising account executive [NHANES]    Frequency of Communication with Friends and Family: More than three times a week    Frequency of Social Gatherings  with Friends and Family: Once a week    Attends Religious Services: More than 4 times per year    Active Member of [employer] or Organizations: Yes    Attends Engineer, structural: More than 4 times per year    Marital Status: Divorced     Vital Signs: There were no vitals taken for this visit.  Physical Exam Patient is alert, oriented and able to participate fully in the conversation. No apparent discomfort or distress observed. She appears appropriately dressed.    Imaging:  R Knee 10/18/22   Kellgren and Salena Craven grade 2   R Knee MRI 05/08/23  Severe medial compartment and patellofemoral cartilage loss. Labs:  CBC: No results for input(s): "WBC", "HGB", "HCT", "PLT" in the last 8760 hours.  COAGS: No results for input(s): "INR", "APTT" in the last 8760 hours.  BMP: No results for input(s): "NA", "K", "CL", "CO2", "GLUCOSE", "BUN", "CALCIUM", "CREATININE", "GFRNONAA", "GFRAA" in the last 8760 hours.  Invalid input(s): "CMP"  LIVER FUNCTION TESTS: No results for input(s): "BILITOT", "AST", "ALT", "ALKPHOS", "PROT", "ALBUMIN" in the last 8760 hours.  Assessment and Plan:  53 year old female with a history of right knee pain s/p geniculate artery embolization 05/30/23.   Creasie Doctor, MD Pager: [phone]    I spent a total of 25 Minutes in virtual video clinical consultation, greater than 50% of which was counseling/coordinating care for right knee pain.

## 2023-06-25 ENCOUNTER — Other Ambulatory Visit: Payer: Self-pay | Admitting: Interventional Radiology

## 2023-06-25 ENCOUNTER — Ambulatory Visit
Admission: RE | Admit: 2023-06-25 | Discharge: 2023-06-25 | Disposition: A | Discharge status: Home / Self Care | Source: Ambulatory Visit | Attending: Interventional Radiology | Admitting: Interventional Radiology

## 2023-06-25 DIAGNOSIS — M1711 Unilateral primary osteoarthritis, right knee: Secondary | ICD-10-CM

## 2023-06-25 DIAGNOSIS — M25562 Pain in left knee: Secondary | ICD-10-CM

## 2023-06-25 HISTORY — PX: IR RADIOLOGIST EVAL & MGMT: IMG5224

## 2023-06-30 ENCOUNTER — Telehealth

## 2023-07-03 ENCOUNTER — Ambulatory Visit
Admission: RE | Admit: 2023-07-03 | Discharge: 2023-07-03 | Disposition: A | Discharge status: Home / Self Care | Source: Ambulatory Visit | Attending: Interventional Radiology | Admitting: Interventional Radiology

## 2023-07-03 DIAGNOSIS — M25562 Pain in left knee: Secondary | ICD-10-CM

## 2023-07-21 ENCOUNTER — Other Ambulatory Visit: Payer: Self-pay | Admitting: Interventional Radiology

## 2023-07-21 DIAGNOSIS — M1712 Unilateral primary osteoarthritis, left knee: Secondary | ICD-10-CM

## 2023-07-25 ENCOUNTER — Telehealth: Payer: Self-pay

## 2023-07-25 DIAGNOSIS — M25562 Pain in left knee: Secondary | ICD-10-CM

## 2023-07-25 MED ORDER — METHYLPREDNISOLONE 4 MG PO TBPK: ORAL_TABLET | ORAL | 0 refills | Status: AC

## 2023-07-25 NOTE — H&P (Signed)
 Chief Complaint: Patient was seen in consultation today for left knee pain.   Referring Physician(s): Self-referral   Supervising Physician: Jennefer Rover  Patient Status: [facility] - Outpatient   History of Present Illness: Shelby Figueroa is a 53 y.o. female with a medical history significant for HTN, DM2, pulmonary sarcoidosis, CHF, morbid obesity (gastric bypass 2022, now on Ozempic), anxiety, bilateral achilles rupture, and chronic bilateral knee pain. She had arthroscopic left knee surgery in 2008.   She requested evaluation by our team for consideration of right geniculate artery embolization for her knee pain. She met with Dr. Jennefer 05/05/23 and complained of bilateral knee pain, right greater than left. Her right knee pain started after she fell on her right knee around 2007. She reported a constant pain at the front, center of the knee. She also endorsed numbness and a burning sensation as well as nerve pain shooting down the back of her right leg. She was not taking any pain medication. She has had prior knee injections, the last in 2018, which have never provided her any relief.    Given that her right knee pain was triggered by a fall Dr. Jennefer recommended a knee MRI for further work up. He wanted to ensure that osteoarthritis was the culprit for her knee pain as opposed to a meniscal or ligamentous injury. Imaging demonstrated advanced right knee osteoarthritis and a degenerative medial meniscal tear.    He discussed the rationale, periprocedural expectations including risks and benefits, and long term expectations related to geniculate artery embolization. She was amenable and wished to proceed. On 05/30/23 she underwent a technically successful right geniculate artery embolization and she tolerated the procedure well. She followed up with Dr. Jennefer 06/25/23 and reported a great response to the procedure. She still had some pain but her mobility had significantly improved.  She expressed in interest in having the left knee treated and an x-ray of the left knee was obtained 07/03/23. This showed severe medial compartment and moderate to severe patellofemoral compartment osteoarthritis.   She is feeling great today and rates her right knee pain at around a 2 out of 10 most days. She is extremely satisfied with the results of the right GAE and is excited to proceed with a left GAE. She rates her left knee pain a 7/10.   Past Medical History:  Diagnosis Date   Anxiety    Arthritis    qwhere (10/09/2015)   Asthma    Bilateral chronic knee pain    CHF (congestive heart failure) (HCC)    Family history of adverse reaction to anesthesia    my mother had anesthesia for OHS; woke up 6 days later (10/09/2015)   GERD (gastroesophageal reflux disease)    Headache    weekly (10/09/2015)   Hypertension    Morbid obesity (HCC)    Sarcoidosis of lung (HCC)    Type II diabetes mellitus (HCC)     Past Surgical History:  Procedure Laterality Date   ACHILLES TENDON REPAIR Bilateral 2007   IR EMBO ARTERIAL NOT HEMORR HEMANG INC GUIDE ROADMAPPING  05/30/2023   IR RADIOLOGIST EVAL & MGMT  05/05/2023   IR RADIOLOGIST EVAL & MGMT  05/19/2023   IR RADIOLOGIST EVAL & MGMT  06/25/2023   KNEE ARTHROSCOPY Left 04/2006   TUBAL LIGATION  1995    Allergies: Ace inhibitors, Codeine, Peanuts [peanut oil], and Penicillins  Medications: Prior to Admission medications   Medication Sig Start Date End Date Taking? Authorizing Provider  albuterol  (PROVENTIL  HFA;VENTOLIN  HFA) 108 (90 BASE) MCG/ACT inhaler Inhale 2 puffs into the lungs every 6 (six) hours as needed for wheezing or shortness of breath.    [provider]  citalopram  (CELEXA ) 40 MG tablet Take 40 mg by mouth daily.    [provider]  furosemide  (LASIX ) 40 MG tablet Take 1 tablet (40 mg total) by mouth 2 (two) times daily. 10/12/15   Dolan Mateo Larger, MD  gabapentin  (NEURONTIN ) 300 MG capsule Take 300 mg  by mouth 3 (three) times daily.    [provider]  glucose monitoring kit (FREESTYLE) monitoring kit 1 each by Does not apply route as needed for other. Please provide lancet, strips for one month supply each. Please check blood sugar 2-3 times per day and follow up with your PCP. 10/12/15   Dolan Mateo Larger, MD  hydrALAZINE  (APRESOLINE ) 25 MG tablet Take 2 tablets (50 mg total) by mouth 2 (two) times daily. 10/12/15   Dolan Mateo Larger, MD  ibuprofen  (ADVIL ,MOTRIN ) 200 MG tablet Take 800 mg by mouth 2 (two) times daily as needed for moderate pain.    [provider]  insulin  aspart (NOVOLOG  FLEXPEN) 100 UNIT/ML FlexPen 0-15 Units, Subcutaneous, 3 times daily with meals,  CBG < 70: implement hypoglycemia protocol and call MD CBG 70 - 120: 0 units CBG 121 - 150: 2 units CBG 151 - 200: 3 units CBG 201 - 250: 5 units CBG 251 - 300: 8 units CBG 301 - 350: 11 units CBG 351 - 400: 15 units CBG > 400: call MD 10/12/15   Dolan Mateo Larger, MD  insulin  glargine (LANTUS ) 100 unit/mL SOPN Inject 0.2 mLs (20 Units total) into the skin at bedtime. 10/12/15   Dolan Mateo Larger, MD  Insulin  Pen Needle 32G X 4 MM MISC Provide 1 month supply 10/12/15   Dolan Mateo Larger, MD  metFORMIN  (GLUCOPHAGE ) 500 MG tablet Take 2 tablets (1,000 mg total) by mouth 2 (two) times daily with a meal. 10/12/15   Dolan Mateo Larger, MD  methylPREDNISolone  (MEDROL  DOSEPAK) 4 MG TBPK tablet Taper Per Instructions 05/29/23   Suttle, Ester PARAS, MD  metoprolol  succinate (TOPROL -XL) 50 MG 24 hr tablet Take 50 mg by mouth daily. Take with or immediately following a meal.    [provider]  montelukast  (SINGULAIR ) 10 MG tablet Take 1 tablet (10 mg total) by mouth at bedtime. 10/12/15   Dolan Mateo Larger, MD  Multiple Vitamins tablet Take 1 tablet by mouth daily.    [provider]  oxyCODONE -acetaminophen  (PERCOCET) 5-325 MG per tablet Take 1 tablet by mouth every 4 (four) hours as  needed for severe pain. 09/24/14   Dyana Vikki SAILOR, DO     No family history on file.  Social History   Socioeconomic History   Marital status: Divorced    Spouse name: Not on file   Number of children: Not on file   Years of education: Not on file   Highest education level: Not on file  Occupational History   Not on file  Tobacco Use   Smoking status: Never   Smokeless tobacco: Never  Substance and Sexual Activity   Alcohol use: Yes    Comment: 10/09/2015 might have a drink 1-2 times/year'   Drug use: No   Sexual activity: Not Currently    Birth control/protection: None  Other Topics Concern   Not on file  Social History Narrative   Not on file   Social Drivers of Health  Financial Resource Strain: Low Risk  (08/13/2021)   Received from [facility], [facility] visits prior to 03/30/2022.   Overall Financial Resource Strain (CARDIA)    Difficulty of Paying Living Expenses: Not very hard  Food Insecurity: Low Risk  (10/09/2022)   [facility] from [facility]   Hunger Vital Sign    Within the past 12 months, you worried that your food would run out before you got money to buy more: Never true    Within the past 12 months, the food you bought just didn't last and you didn't have money to get more. : Never true  Transportation Needs: No Transportation Needs (10/09/2022)   Received from [facility]    In the past 12 months, has lack of reliable transportation kept you from medical appointments, meetings, work or from getting things needed for daily living? : No  Physical Activity: Insufficiently Active (08/13/2021)   Received from [facility], [facility] visits prior to 03/30/2022.   Exercise Vital Sign    On average, how many days per week do you engage in moderate to strenuous exercise (like a brisk walk)?: 3 days    On average, how many minutes do you engage in exercise at this level?: 30 min  Stress: Stress  Concern Present (08/13/2021)   Received from [facility], [facility] visits prior to 03/30/2022.   [employer] of Occupational Health - Occupational Stress Questionnaire    Feeling of Stress : Very much  Social Connections: Moderately Integrated (08/13/2021)   Received from [facility], [facility] visits prior to 03/30/2022.   Social Connection and Isolation Panel    In a typical week, how many times do you talk on the phone with family, friends, or neighbors?: More than three times a week    How often do you get together with friends or relatives?: Once a week    How often do you attend church or religious services?: More than 4 times per year    Do you belong to any clubs or organizations such as church groups, unions, fraternal or athletic groups, or school groups?: Yes    How often do you attend meetings of the clubs or organizations you belong to?: More than 4 times per year    Are you married, widowed, divorced, separated, never married, or living with a partner?: Divorced    Review of Systems: A 12 point ROS discussed and pertinent positives are indicated in the HPI above.  All other systems are negative.  Review of Systems  All other systems reviewed and are negative.   Vital Signs: There were no vitals taken for this visit.  Physical Exam Constitutional:      General: She is not in acute distress.    Appearance: She is obese. She is not ill-appearing.  HENT:     Mouth/Throat:     Mouth: Mucous membranes are moist.     Pharynx: Oropharynx is clear.  Pulmonary:     Effort: Pulmonary effort is normal.  Abdominal:     Tenderness: There is no abdominal tenderness.   Musculoskeletal:        General: Tenderness present.     Right lower leg: No edema.     Left lower leg: No edema.     Comments: Left knee pain - tenderness to palpation at the inferior, medial aspect of the anterior knee.    Skin:  General: Skin is warm  and dry.   Neurological:     Mental Status: She is alert and oriented to person, place, and time.   Psychiatric:        Mood and Affect: Mood normal.        Behavior: Behavior normal.        Thought Content: Thought content normal.        Judgment: Judgment normal.     Imaging:  Left knee 07/03/23      R Knee 10/18/22   Kellgren and Jerilynn grade 2   R Knee MRI 05/08/23  Severe medial compartment and patellofemoral cartilage loss.   Right GAE 05/08/23      Labs:  CBC: No results for input(s): WBC, HGB, HCT, PLT in the last 8760 hours.  COAGS: No results for input(s): INR, APTT in the last 8760 hours.  BMP: No results for input(s): NA, K, CL, CO2, GLUCOSE, BUN, CALCIUM, CREATININE, GFRNONAA, GFRAA in the last 8760 hours.  Invalid input(s): CMP  LIVER FUNCTION TESTS: No results for input(s): BILITOT, AST, ALT, ALKPHOS, PROT, ALBUMIN in the last 8760 hours.  TUMOR MARKERS: No results for input(s): AFPTM, CEA, CA199, CHROMGRNA in the last 8760 hours.  Assessment and Plan:  Left knee pain: Shelby Figueroa, 53 year old female, presents today for an image-guided left geniculate artery embolization.  Risks and benefits of this procedure were discussed with the patient including, but not limited to bleeding, infection, vascular injury or contrast induced renal failure.  All of the patient's questions were answered, patient is agreeable to proceed. She has been NPO.   Consent signed and in chart.  Thank you for this interesting consult.  I greatly enjoyed meeting Shelby Figueroa and look forward to participating in their care.  A copy of this report was sent to the requesting provider on this date.  Electronically Signed: Warren Dais, AGACNP-BC 07/28/2023, 7:49 AM   I spent a total of  30 Minutes   in face to face in clinical consultation, greater than 50% of which was counseling/coordinating care for left  knee pain.

## 2023-07-25 NOTE — Telephone Encounter (Signed)
 Patient was called and reminded about upcoming GAE procedure on 07/28/23 and told that medrol  dosepak was called in to her [facility]. It is to be taken as prescribed after her GAE procedure. Reminded her to wear comfortable clothing and shoes.

## 2023-07-28 ENCOUNTER — Ambulatory Visit
Admission: RE | Admit: 2023-07-28 | Discharge: 2023-07-28 | Disposition: A | Discharge status: Home / Self Care | Source: Ambulatory Visit | Attending: Interventional Radiology | Admitting: Interventional Radiology

## 2023-07-28 DIAGNOSIS — M1712 Unilateral primary osteoarthritis, left knee: Secondary | ICD-10-CM

## 2023-07-28 HISTORY — PX: IR EMBO ARTERIAL NOT HEMORR HEMANG INC GUIDE ROADMAPPING: IMG5448

## 2023-07-28 MED ORDER — ACETAMINOPHEN 10 MG/ML IV SOLN
1000.0000 mg | Freq: Once | INTRAVENOUS | Status: AC
  Administered 2023-07-28: 1000 mg via INTRAVENOUS

## 2023-07-28 MED ORDER — NITROGLYCERIN 1 MG/10 ML FOR IR/CATH LAB
INTRA_ARTERIAL | Status: AC | PRN
  Administered 2023-07-28 (×2): 100 ug via INTRA_ARTERIAL

## 2023-07-28 MED ORDER — VANCOMYCIN HCL IN DEXTROSE 1-5 GM/200ML-% IV SOLN: 1000.0000 mg | INTRAVENOUS | Status: DC

## 2023-07-28 MED ORDER — LIDOCAINE-EPINEPHRINE 1 %-1:100000 IJ SOLN
10.0000 mL | Freq: Once | INTRAMUSCULAR | Status: AC
  Administered 2023-07-28: 10 mL via INTRADERMAL

## 2023-07-28 MED ORDER — FENTANYL CITRATE (PF) 100 MCG/2ML IJ SOLN
INTRAMUSCULAR | Status: AC | PRN
  Administered 2023-07-28: 50 ug via INTRAVENOUS

## 2023-07-28 MED ORDER — ONDANSETRON HCL 4 MG/2ML IJ SOLN: 4.0000 mg | Freq: Once | INTRAMUSCULAR | Status: DC

## 2023-07-28 MED ORDER — IIOPAMIDOL (ISOVUE-250) INJECTION 51%
100.0000 mL | Freq: Once | INTRAVENOUS | Status: AC | PRN
  Administered 2023-07-28: 80 mL via INTRA_ARTERIAL

## 2023-07-28 MED ORDER — DEXAMETHASONE SODIUM PHOSPHATE 10 MG/ML IJ SOLN
10.0000 mg | Freq: Once | INTRAMUSCULAR | Status: AC
  Administered 2023-07-28: 10 mg via INTRAVENOUS

## 2023-07-28 MED ORDER — KETOROLAC TROMETHAMINE 30 MG/ML IJ SOLN
30.0000 mg | Freq: Once | INTRAMUSCULAR | Status: AC
  Administered 2023-07-28: 30 mg via INTRAVENOUS

## 2023-07-28 MED ORDER — SODIUM CHLORIDE 0.9 % IV SOLN: INTRAVENOUS | Status: DC

## 2023-07-28 MED ORDER — KETOROLAC TROMETHAMINE 30 MG/ML IJ SOLN: 30.0000 mg | Freq: Once | INTRAMUSCULAR | Status: DC

## 2023-07-28 MED ORDER — NITROGLYCERIN 1 MG/10 ML FOR IR/CATH LAB: 100.0000 ug | INTRA_ARTERIAL | Status: DC | PRN

## 2023-07-28 MED ORDER — MIDAZOLAM HCL 2 MG/2ML IJ SOLN: 1.0000 mg | INTRAMUSCULAR | Status: DC | PRN

## 2023-07-28 MED ORDER — FENTANYL CITRATE PF 50 MCG/ML IJ SOSY: 25.0000 ug | PREFILLED_SYRINGE | INTRAMUSCULAR | Status: DC | PRN

## 2023-07-28 MED ORDER — MIDAZOLAM HCL 2 MG/2ML IJ SOLN
INTRAMUSCULAR | Status: AC | PRN
  Administered 2023-07-28: 1 mg via INTRAVENOUS

## 2023-07-28 MED ORDER — VANCOMYCIN HCL 1500 MG/300ML IV SOLN: 1500.0000 mg | INTRAVENOUS | Status: DC

## 2023-07-28 NOTE — Procedures (Signed)
 Interventional Radiology Procedure Note  Procedure: Left geniculate artery embolization  Findings: Please refer to procedural dictation for full description. Left 4 Fr posterior tibial access.  Complications: None immediate  Estimated Blood Loss: < 5 mL  Recommendations: IR will arrange 1 month outpatient follow up.   Ester Sides, MD

## 2023-07-28 NOTE — Discharge Instructions (Signed)
 Discharge Instructions for Genicular Artery Embolization (GAE)     Rest for the remainder of the day.  Avoid strenuous activities and heavy lifting for 48 hours. Gradually resume normal activities as tolerated.   You may experience mild pain or discomfort at the catheter insertion site or in the knee. This is normal. Use over-the-counter pain relievers such as acetaminophen  (Tylenol ) or ibuprofen  (Advil ) as directed. Apply an ice pack to the knee for 15-20 minutes every 2-3 hours to reduce swelling and discomfort.  Take the Medrol  Dosepak as directed per pharmacy.   Keep the catheter insertion site clean and dry. Remove the bandage after 24 hours and replace it with a clean, dry bandage if needed. Avoid soaking in baths, hot tubs, or swimming pools for 5 days. Showers are allowed.   Take any prescribed medications as directed. If you were taking blood thinners, follow your physician's instructions on when to resume them.   Resume your normal diet. Drink plenty of fluids to stay hydrated.   Schedule a follow-up appointment with your physician as instructed.   Call our office at [phone] with any concerns, including if you experience increased pain, swelling, warmth, redness, or drainage at the insertion site, if you have a fever over 100.39F (38C), or if you experience sudden weakness or numbness in the treated leg.   Please ensure you follow these instructions carefully and reach out to your healthcare provider if you have any concerns or questions. Wishing you a smooth and speedy recovery!    If you need to speak to someone after hours or on the weekend, please call the Interventional Radiologist on-call service at [phone]. Tell them you are a patient of Dr. Terrill and that you had a GAE today, along with any issues you are having.    Thank you for visiting [facility]!

## 2023-07-29 ENCOUNTER — Telehealth: Payer: Self-pay

## 2023-07-29 NOTE — Telephone Encounter (Signed)
 Phone call to patient after her GAE 07/28/23.  She states she was fairly uncomfortable yesterday but is better today after having a good night's rest and taking Tylenol .

## 2023-08-27 ENCOUNTER — Other Ambulatory Visit: Payer: Self-pay | Admitting: Interventional Radiology

## 2023-08-27 DIAGNOSIS — M25562 Pain in left knee: Secondary | ICD-10-CM

## 2023-10-03 NOTE — Progress Notes (Signed)
 HPI:  53 y.o. G21P3003 female, No LMP recorded (lmp unknown). Patient is postmenopausal., who is here for concern for postmenopausal bleeding. She reports that her last period was approximately 5 years ago and earlier this week she had vaginal bleeding from 8/30 until 9/4 and during that time she felt like she was on her cycle. She reports that she felt moody and had breast tenderness. She reports that the bleeding was dark blood, almost brown, and was heavy for the first three day. She has been thinking about it for a long time and she is interested in a hysterectomy. She is not on HRT.She endorses some recent medication changes with her PCP: discontinued gabapentin  and decreased dose of amlodipine .   Review of Systems:  Negative except as noted in HPI.  Current Rx[1]  Allergies[2]  Medical History[3]  Surgical History[4]   Family History[5]  Social History[6]  Vitals:   10/03/23 1052  BP: 147/86  Pulse: 89     EXAM:  General appearance: well nourished, well hydrated, no acute distress Pelvic:  deferred Mental Status Exam:  Judgment, insight: intact Orientation: oriented to time, place, and person Memory: intact for recent and remote events Mood and affect: no depression, anxiety, or agitation      IMPRESSION/PLAN  1) Postmenopausal bleeding: -vaginal bleeding x5 days, not on HRT -reviewed possible etiologies for postmenopausal bleeding including concern for endometrial cancer & reviewed options of EMB and pelvic u/s. Patient would like to proceed with u/s and desires hysterectomy -will send message to Dr. Cleotilde for pre-op appt  Requested Prescriptions    No prescriptions requested or ordered in this encounter    Orders Placed This Encounter  Procedures  . US  Pelvis Complete Wylie Riggs w Duplex    Tinnie Almarie Dearth, CNM        [1] Current Outpatient Medications  Medication Sig Dispense Refill  . accu-chek softclix misc Use to lance skin to  check blood sugar levels 4 times daily.  Please dispense based on availability, insurance coverage, and patient preference. 1 each 3  . albuterol  HFA (PROVENTIL  HFA;VENTOLIN  HFA;PROAIR  HFA) 90 mcg/actuation inhaler Inhale 2 puffs every 4 (four) hours as needed for wheezing. 17 g 11  . alcohol swabs padm Use 1 swab as needed to clean site for blood sugar check. 100 each 11  . amLODIPine  (NORVASC ) 10 mg tablet Take 1 tablet (10 mg total) by mouth daily. 100 tablet 0  . atorvastatin (LIPITOR) 20 mg tablet Take 1 tablet (20 mg total) by mouth daily. 100 tablet 0  . azelastine (ASTELIN) 137 mcg (0.1 %) nasal spray 1 spray 2 (two) times a day. 30 mL 0  . blood-glucose meter misc Use meter to check blood sugar 4 times a day.  Please dispense based on availability, insurance coverage, and patient preference. 1 each 0  . budesonide-formoteroL (Symbicort) 160-4.5 mcg/actuation inhaler INHALE 2 PUFFS INTO THE LUNGS 2 TIMES DAILY. 3 each 1  . calcium citrate 250 mg calcium tab Take 1 tablet by mouth Once Daily.    . cholecalciferol (VITAMIN D3) 5,000 unit (125 mcg) tab tablet Take 5,000 Units by mouth Once Daily.    . cyclobenzaprine (FLEXERIL) 5 mg tablet Take 1 tablet (5 mg total) by mouth nightly as needed for muscle spasms. 90 tablet 0  . fexofenadine (Allegra Allergy) 180 mg tablet Take 1 tablet (180 mg total) by mouth daily. 90 tablet 3  . fluticasone propionate (FLONASE) 50 mcg/spray nasal spray Administer 1-2 sprays into each nostril  daily as needed for rhinitis or allergies. 16 g 11  . furosemide  (LASIX ) 40 mg tablet Take 1 tablet (40 mg total) by mouth daily as needed (Ankle swelling). 90 tablet 0  . gabapentin  (NEURONTIN ) 300 mg capsule TAKE 2 CAPSULES BY MOUTH 3 TIMES DAILY 600 capsule 2  . glucose blood (OneTouch Verio test strips) test strip Use to check blood sugars 4 times daily 100 each 0  . glucose blood (Precision Q-I-D Test) test strip Use 1 strip to check blood sugar 4 times a day. Please  dispense based on availability, insurance coverage, and patient preference. 100 strip 3  . hydrALAZINE  (APRESOLINE ) 25 mg tablet TAKE 1 TABLET BY MOUTH 3 TIMES  DAILY 300 tablet 2  . lancets 30 gauge misc Use to check blood sugar 4 times daily. 100 each 0  . metFORMIN  (GLUCOPHAGE ) 500 mg tablet Take 1 tablet (500 mg total) by mouth 2 (two) times a day. 90 tablet 3  . multivitamin-min-iron-folic acid-vit K (Bariatric Multivitamins) 45 mg iron- 800 mcg-120 mcg cap Take 1 tablet by mouth Once Daily.    SABRA PARoxetine (PAXIL) 20 mg tablet Take 1 tablet (20 mg total) by mouth daily. 90 tablet 0  . pen needle, diabetic 32 gauge x 5/32 ndle Place 1 each on the skin 3 (three) times a day. 300 each 0  . spironolactone (ALDACTONE) 50 mg tablet TAKE 1 TABLET BY MOUTH DAILY 100 tablet 2   No current facility-administered medications for this visit.  [2] Allergies Allergen Reactions  . Penicillins Itching  . Ace Inhibitors Angioedema    Includes antihypertensives  . Codeine Itching  . Nut Flavor Itching    Itching of throat  . Peanut Itching    MAKES HER THROAT ITCH  [3] Past Medical History: Diagnosis Date  . Atypical pneumonia 10/27/2014  . Cellulitis 06/18/2012  . Gastroesophageal reflux disease without esophagitis 12/04/2010  . Hypertension   . MAC (mycobacterium avium-intracellulare complex)    (CMD) 09/14/2011  . Morbid obesity with BMI of 60.0-69.9, adult (CMD) 08/13/2011  . Osteoarthritis of both knees 12/04/2010  . Pulmonary MAI (mycobacterium avium-intracellulare) infection    (CMD)   . Pulmonary nodule seen on imaging study 04/21/2015  . Rupture of Achilles tendon 12/04/2010  . Sarcoidosis 10/07/2014  . SOB (shortness of breath) 09/13/2011  . Type 2 diabetes mellitus without complication    (CMD) 07/21/2014  [4] Past Surgical History: Procedure Laterality Date  . ACHILLES TENDON SURGERY     Procedure: ACHILLES TENDON REPAIR  . EPIDURAL BLOCK INJECTION Bilateral 03/10/2018    Procedure: LUMBAR MEDIAL BRANCH BLOCK- Bilateral L2-S1 MBB #1;  Surgeon: Lamar LELON Sprang, MD;  Location: [id number] [facility];  Service: Stefanie Ast;  Laterality: Bilateral;  per Dr Wallene  . KNEE SURGERY     Procedure: KNEE SURGERY; left-2008  . LAPAROSCOPIC GASTRIC BYPASS N/A 09/12/2020   Procedure: GASTRIC BYPASS LAPAROSCOPIC;  Surgeon: Laurel Cheral Monte, MD;  Location: [facility] MAIN OR;  Service: General;  Laterality: N/A;  . OTHER SURGICAL HISTORY     Procedure: OTHER SURGICAL HISTORY (knee surgery - arthoscopy)  . TUBAL LIGATION     Procedure: TUBAL LIGATION  [5] Family History Problem Relation Name Age of Onset  . Stroke Mother    . Hypertension Mother    . Cancer Father         pancreatic  . Diabetes Father    . Stroke Father    . Hypertension Father    . Kidney disease  Father    . Heart disease Sister         below age 55  . Hypertension Sister    . Hyperlipidemia Sister    . Cancer Paternal Grandmother         lung  . Asthma Son    . Breast cancer Paternal Aunt    . Cancer Paternal Aunt         ovarian  . Cancer Paternal Uncle         prostate  . Anesthesia problems Neg Hx    [6] Social History Socioeconomic History  . Marital status: Divorced  Tobacco Use  . Smoking status: Never  . Smokeless tobacco: Never  Substance and Sexual Activity  . Alcohol use: No  . Drug use: No  Social History Narrative   Lives with mother now.   She is a never smoker and no one else in her house smokes.  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  Unfortunately   , because of her symptoms, she has actually turned down a  couple of recent invitations to sing in weddings over a concern for her  coughing and inability to hold long notes.  She has also worked in the past  at [employer] for five years, but had difficulty    continuing that job after her  bilateral  Achilles rupture.    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       Social Drivers of Health   Food Insecurity: Low Risk  (10/09/2022)   Food vital sign   . Within the past 12 months, you worried that your food would run out before you got money to buy more: Never true   . Within the past 12 months, the food you bought just didn't last and you didn't have money to get more: Never true  Recent Concern: Food Insecurity - Medium Risk (09/03/2022)   Food vital sign   . Within the past 12 months, you worried that your food would run out before you got money to buy more: Never true   . Within the past 12 months, the food you bought just didn't last and you didn't have money to get more: Sometimes true  Transportation Needs: No Transportation Needs (10/09/2022)   Transportation   . In the past 12 months, has lack of reliable transportation kept you from medical appointments, meetings, work or from getting things needed for daily living? : No  Recent Concern: Transportation Needs - Unmet Transportation Needs (09/03/2022)   Transportation   . In the past 12 months, has lack of reliable transportation kept you from medical appointments, meetings, work or from getting things needed for daily living? : Yes  Safety: Low Risk  (01/28/2023)   Safety   . How often does anyone, including family and friends, physically hurt you?: Never   . How often does anyone, including family and friends, insult or talk down to you?: Never   . How often does anyone, including family and friends, threaten you with harm?: Never   . How often does anyone, including family and friends, scream or curse at you?: Never  Living Situation: Low Risk  (10/09/2022)   Living Situation   . What is your  living situation today?: I have a steady place to live   . Think about the place you live. Do you have problems with any  of the following? Choose all that apply:: None/None on this list

## 2023-10-07 NOTE — Telephone Encounter (Signed)
 Unable to reach patient scheduled and left voicemail [id number]
